# Patient Record
Sex: Male | Born: 1935 | Race: White | Hispanic: No | Marital: Single | Smoking: Current some day smoker
Health system: Southern US, Community
[De-identification: ages and names within clinical notes are randomized; demographics above are authoritative.]

## PROBLEM LIST (undated history)

## (undated) DIAGNOSIS — I10 Essential (primary) hypertension: Secondary | ICD-10-CM

## (undated) DIAGNOSIS — J449 Chronic obstructive pulmonary disease, unspecified: Secondary | ICD-10-CM

## (undated) DIAGNOSIS — E785 Hyperlipidemia, unspecified: Secondary | ICD-10-CM

## (undated) DIAGNOSIS — E119 Type 2 diabetes mellitus without complications: Secondary | ICD-10-CM

## (undated) HISTORY — PX: COLON SURGERY: SHX602

---

## 2004-01-19 ENCOUNTER — Other Ambulatory Visit: Payer: Self-pay

## 2005-01-22 ENCOUNTER — Inpatient Hospital Stay: Payer: Self-pay | Admitting: Internal Medicine

## 2005-01-24 ENCOUNTER — Other Ambulatory Visit: Payer: Self-pay

## 2005-02-16 ENCOUNTER — Inpatient Hospital Stay: Payer: Self-pay | Admitting: Internal Medicine

## 2005-03-03 ENCOUNTER — Ambulatory Visit: Payer: Self-pay | Admitting: Internal Medicine

## 2005-04-19 ENCOUNTER — Encounter (INDEPENDENT_AMBULATORY_CARE_PROVIDER_SITE_OTHER): Payer: Self-pay | Admitting: Cardiology

## 2005-04-19 ENCOUNTER — Inpatient Hospital Stay (HOSPITAL_COMMUNITY): Admission: EM | Admit: 2005-04-19 | Discharge: 2005-04-28 | Payer: Self-pay | Admitting: Emergency Medicine

## 2005-04-20 ENCOUNTER — Ambulatory Visit: Payer: Self-pay | Admitting: Hematology and Oncology

## 2005-04-20 ENCOUNTER — Ambulatory Visit: Payer: Self-pay | Admitting: Gastroenterology

## 2005-04-22 ENCOUNTER — Encounter (INDEPENDENT_AMBULATORY_CARE_PROVIDER_SITE_OTHER): Payer: Self-pay | Admitting: *Deleted

## 2005-04-28 ENCOUNTER — Ambulatory Visit: Payer: Self-pay | Admitting: Hematology and Oncology

## 2007-03-12 ENCOUNTER — Inpatient Hospital Stay (HOSPITAL_COMMUNITY): Admission: EM | Admit: 2007-03-12 | Discharge: 2007-03-16 | Payer: Self-pay | Admitting: Emergency Medicine

## 2007-03-12 ENCOUNTER — Encounter: Payer: Self-pay | Admitting: Gastroenterology

## 2007-03-14 ENCOUNTER — Ambulatory Visit: Payer: Self-pay | Admitting: Gastroenterology

## 2007-03-15 ENCOUNTER — Encounter: Payer: Self-pay | Admitting: Gastroenterology

## 2007-03-27 ENCOUNTER — Inpatient Hospital Stay (HOSPITAL_COMMUNITY): Admission: EM | Admit: 2007-03-27 | Discharge: 2007-04-05 | Payer: Self-pay | Admitting: Emergency Medicine

## 2007-04-16 ENCOUNTER — Ambulatory Visit: Payer: Self-pay | Admitting: Internal Medicine

## 2008-04-16 IMAGING — NM NM GI BLOOD LOSS
2 series · 12 of 12 positions shown · non-contrast
Comparison: None.

CLINICAL DATA: Melena over the last 5 days.

NM GASTROINTESTINAL BLEEDING SCAN  03/30/2007:
TECHNIQUE: Sequential abdominal images were obtained following intravenous
administration of Oc-77m labeled red blood cells.
Radiopharmaceutical:  25 mCi Oc-77m in-vitro labeled red cells

[Series 1: gi gi bleed · 4.70mm/px · 6 of 60 frames shown (1 of 2)]
[frame 6/60]
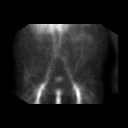
[frame 16/60]
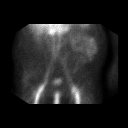
[frame 26/60]
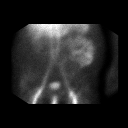
[frame 36/60]
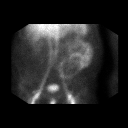
[frame 46/60]
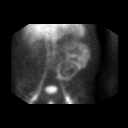
[frame 56/60]
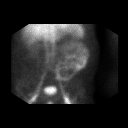

[Series 1: gi gi bleed · 4.70mm/px · 6 of 60 frames shown (2 of 2)]
[frame 6/60]
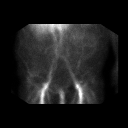
[frame 16/60]
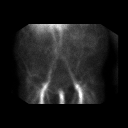
[frame 26/60]
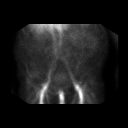
[frame 36/60]
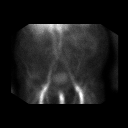
[frame 46/60]
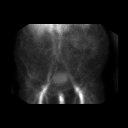
[frame 56/60]
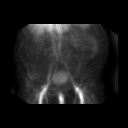

[12 of 12 positions shown; findings below may reference images not displayed]

FINDINGS: Active gastrointestinal bleeding identified in the left upper
quadrant of the abdomen, within a loop of jejunum. Radiotracer was then carried
through the small bowel of the left upper quadrant. Expected excretion into the
urinary tract noted.
IMPRESSION: Active gastrointestinal bleeding involving the jejunum within the left upper
quadrant. 

Results were text paged and telephoned to the [REDACTED]ist caring for
the patient at the time of interpretation 03/30/2007  6487 hours.

## 2009-02-26 ENCOUNTER — Encounter (INDEPENDENT_AMBULATORY_CARE_PROVIDER_SITE_OTHER): Payer: Self-pay | Admitting: *Deleted

## 2009-11-03 ENCOUNTER — Telehealth: Payer: Self-pay | Admitting: Gastroenterology

## 2010-07-13 NOTE — Progress Notes (Signed)
Summary: Schedule Colonoscopy  Phone Note Outgoing Call   Call placed by: Lamona Curl CMA Duncan Dull),  Nov 03, 2009 1:51 PM Call placed to: Patient Summary of Call: Patient is overdue for colonoscopy at this time. His last colonoscopy in 2008 showed adenomatous colon polyps, diverticulosis and internal hemorrhoids which he needs to have followed up. I have left message on patient's voicemail to call back. Initial call taken by: Lamona Curl CMA Duncan Dull),  Nov 03, 2009 1:53 PM  Follow-up for Phone Call        I have left a message for the patient to call back.  Follow-up by: Lamona Curl CMA Duncan Dull),  Nov 10, 2009 9:16 AM     Appended Document: Schedule Colonoscopy Patient never returned phone call. We will send a letter.

## 2010-10-26 NOTE — Discharge Summary (Signed)
NAME:  Cichowski, Vedder                  ACCOUNT NO.:  [id number]   MEDICAL RECORD NO.:  [id number]          PATIENT TYPE:  INP   LOCATION:  5501                         FACILITY:  [facility]   PHYSICIAN:  Hind I Elsaid, MD      DATE OF BIRTH:  1932-09-12   DATE OF ADMISSION:  03/27/2007  DATE OF DISCHARGE:                               DISCHARGE SUMMARY   INTERIM DISCHARGE SUMMARY:   PRIMARY CARE PHYSICIAN:  Dr. Maryellen Pile from [facility].   DISCHARGE DIAGNOSES:  1. Acute blood loss anemia, status post 7 units of blood transfusions.  2. 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  3. Arteriovenous malformation/dysplasia.  4. Diabetes mellitus.  5. Diverticulosis.  6. Cystic ulcer disease.  7. History of coronary artery disease.  8. Chronic renal insufficiency with a baseline creatinine of 1.4.   DISCHARGE MEDICATIONS:  To be dictated at the date of discharge.   CONSULTATIONS:  Gastroenterology consulted for evaluation of the active  GI bleeding.  Done by Dr. Juanda Chance and Dr. Cay Schillings.   PROCEDURES:  1. Chest x-ray:  No acute cardiopulmonary disease, probable COPD.      Could represent as a vessel with a circumscribed 1 cm nodule.      Consider follow up plain film in approximately 3 months or a      nonemergent outpatient chest CT to be performed.  2. RBC nuclear scan which showed active gastrointestinal bleeding      involving the jejunum within the left upper quadrant.  3. Small bowel enteroscopy.  Result was normal proximal esophagus      jejunum with bariatric colonoscopy was passed as stably as possible      to the small bowel.  No small bowel AVM were seen.  There was no      active bleeding.   HISTORY OF PRESENT ILLNESS:  Please review the history done by Dr.  Lilly Cove.  This is a 77 year old pleasant male, recent  hospitalization at the end of September and beginning of October for  lower GI bleeding required a blood  transfusion, underwent colonoscopy  and EGD and was found to have benign colonic polyp and angiodysplasia.  There was no evidence of active bleeding.  This time, the patient  presented with dark stool and anemia with hemoglobin of 7.  1. Acute blood loss anemia secondary to GI bleeding:  The patient was      admitted to the hospital.  The patient received blood transfusion.      During hospitalization, almost about 7 units of packed RBCs.      Gastroenterology were consulted where review of previous      colonoscopy and EGD was done.  Nuclear RBC scan was done also      during this hospitalization which showed evidence of active blood      loss on the upper part of the jejunum.  At that time, the patient      was mildly hypotensive and tachycardic status post blood      transfusion.  Gastroenterology recommended enteroscopy which was      done by Dr. Cay Schillings during the weekend without evidence of active      blood loss found during the endoscopy.  After that, the patient      remained stable with hemoglobin around 10.2.  Plan to discharge the      patient with ferrous sulfate.  Yesterday, the patient also noticed      dark melena and became mildly hypotensive status post 1 unit of      blood transfusion.  Gastroenterology informed about the situation,      and their recommendation was to transfuse 1 unit of blood      transfusion, and the patient is not actively bleeding.  The amount      of blood loss is not more than 1 mL per minute for evaluation for      arteriogram and possible embolization.  Plan for the patient to      continue to monitor H and H.  If he has signs of free bleeding      clinically, which includes hypotension, tachycardia, or large      volume melena, he should be arranged for immediate angiography      targeting the region of the bowel with his jejunum.  Will continue      to monitor the patient periodically with CBC and monitor the      clinical situation of the  patient.  2. Diabetes mellitus:  The patient during hospitalization remained on      insulin.  To restart his metformin, his oral hypoglycemic      medication, on the date of discharge.  A detailed discussion of the      patient as he has chronic renal insufficiency with a baseline      creatinine of 1.4, metformin dose is contraindicated but the      patient refused to start any other hypoglycemic agent.  3. Chronic renal insufficiency:  Remained stable around 1.4 to 1.3.  4. Coronary artery disease:  Remained stable.   DISPOSITION:  The patient is to be discharged home when the bleeding  stops and after further evaluation by gastroenterology.  They would  recommend further workup.      Hind Bosie Helper, MD  Electronically Signed     HIE/MEDQ  D:  04/03/2007  T:  04/04/2007  Job:  [id number]

## 2010-10-26 NOTE — H&P (Signed)
NAME:  [name] [name], [name] [name]                  ACCOUNT NO.:  [id number]   MEDICAL RECORD NO.:  [id number]          PATIENT TYPE:  INP   LOCATION:  5501                         FACILITY:  [facility]   PHYSICIAN:  Wilson Singer, M.D.DATE OF BIRTH:  [birth year]   DATE OF ADMISSION:  03/27/2007  DATE OF DISCHARGE:                              HISTORY & PHYSICAL   HISTORY:  This is a very pleasant 77 year old man who was recently  hospitalized at the end of September, beginning of October for a lower  GI bleed which required a blood transfusion.  He underwent colonoscopy  at that time, and was found to have benign colon polyps, but no other  source of bleeding.  He has a history of cecal arteriovenous  malformation and angiodysplasia which led to endoclips and hemostasis  achievement in 2006.  He also has a history of diverticulosis.  He now  presents with another 3 to 4 day history of rectal bleeding.  He feels  short of breath and tired.   There is no hematemesis or abdominal pain.   PAST MEDICAL HISTORY:  1. Cecal AVM as mentioned above.  2. Diverticulosis as mentioned above.  3. Type 2 non-insulin dependent diabetes mellitus.  4. History of peptic ulcer disease in the past.  5. History of carotid artery disease but requiring no surgery.   PSYCHIATRIC DIAGNOSIS:  Removal of a colon tumor which was benign in  2006.   SOCIAL HISTORY:  He is a widower.  He does not smoke.  He does not drink  alcohol.  He is retired.   MEDICATIONS:  1. Metformin 1 gram b.i.d.  2. Altace 5 mg every day.  3. Omeprazole 20 mg every day.  4. Glipizide 10 mg b.i.d.  5. Aspirin 1 tablet every day.  6. Ferrous sulfate 325 mg t.i.d.  7. Folic acid 1 mg every day.   ALLERGIES:  NONE.   FAMILY HISTORY:  Noncontributory.   REVIEW OF SYSTEMS:  The problem list is mentioned above.  There are no  other symptoms referable to all systems reviewed.   EXAMINATION:  VITAL SIGNS:  Appear to be stable.  GENERAL:  He does  look clinically pale.  CARDIOVASCULAR:  Heart sounds are present and normal.  RESPIRATORY:  Lung fields are clear.  ABDOMEN:  Soft, nontender with no hepatosplenomegaly.  Fecal occult  blood is positive.  NEUROLOGICAL:  Alert and oriented with no focal neurological signs.   INVESTIGATIONS:  Hemoglobin 7, with an MCV of 74.5, white blood cell  count 16.2, platelets 401,000.  Sodium 133, potassium 4, bicarbonate 26,  glucose 367, BUN 41, creatinine 1.66.  A chest x-ray shows no major  abnormalities except for a well circumscribed 1 cm nodule at the left  lung base.  Suggestion is made to get a CT of the chest non-emergently.   IMPRESSION:  1. Lower gastrointestinal bleed, possible cecal arteriovenous      malformation.  2. Type 2 non-insulin dependent diabetes mellitus.  3. Hypertension.   PLAN:  1. Admit.  2. Transfuse 3 units of packed red blood  cells.  3. Control diabetes and hypertension.  4. Consider a PGI consultation in the morning.   Further recommendations will depend on the patient's hospital progress.      Wilson Singer, M.D.  Electronically Signed     NCG/MEDQ  D:  03/27/2007  T:  03/28/2007  Job:  [id number]

## 2010-10-26 NOTE — Discharge Summary (Signed)
NAME:  [name] [name], [name] [name]                  ACCOUNT NO.:  [id number]   MEDICAL RECORD NO.:  [id number]          PATIENT TYPE:  INP   LOCATION:  6702                         FACILITY:  [facility]   PHYSICIAN:  Hind I Elsaid, MD      DATE OF BIRTH:  08/02/1933   DATE OF ADMISSION:  03/11/2007  DATE OF DISCHARGE:                               DISCHARGE SUMMARY   PRIMARY CARE PHYSICIAN:  Dr. Maryellen Pile from [facility]   DISCHARGE DIAGNOSES:  1. Anemia secondary to chronic blood loss.  2. Cecal arteriovenous malformation.  3. Angiodysplasia without hemorrhage.  4. Arteriovenous malformation/angiodysplasia without hemorrhage.  5. Diabetes mellitus.  6. Diverticulosis.  7. Peptic ulcer disease.  8. History of coronary artery disease.  9. Hemorrhoids.  10._adenomatousis  polyps.   DISCHARGE MEDICATIONS:  1. Metformin 1 g p.o. b.i.d.  2. Omeprazole 20 mg daily.  3. Aspirin 81 mg p.o. daily.  4. Norpace 5 mg p.o. daily.  5. Glyburide 10 mg p.o. daily.  6. Folic acid 1 mg p.o. daily.  7. Ferrous sulfate 325 mg p.o. b.i.d.   CONSULTATION:  Dr. Dalene Carrow was consulted.   PROCEDURES:  1. Abdominal x-ray  chronic obstructive pulmonary disease and chronic      bronchitis.  No acute abnormalities.  2. EGD and colonoscopy showed diverticulosis/colon polyp and      hemorrhoids.  EGD showed angiodysplasia without hemorrhage,  with      no active intervention, status post biopsy of the small bowel to      evaluate for tropical sprue.   HISTORY OF PRESENT ILLNESS:  This is a 77 year old male with a history  of cecal AV malformation and angiodysplasia status post hemostasis with  endoclips done in 2006.  History of iron-deficiency anemia secondary to  rectal bleeding.  Presented to the hospital where he complained of black  stools  and anemia, with hemoglobin of 7 when checked in the PMDs  office, where he was advised to come to the hospital for further  evaluation.   PROBLEMS:  1. Rectal bleeding, most  probably secondary to cecal AV malformation      and angiodysplasia.  Patient was admitted to the hospital.      Received total of 3 units of PRBCS , gastroenterology consulted,      where the patient underwent EGD/colonoscopy with the results      showing  angiodysplasia without evidence of active BLEEDINGeding,      without evidence of active bleeding.  No intervention was done by      gastroenterologist, and they recommend to continue on folic acid      and ferrous sulfate.  Hemoglobin remained stable during      hospitalization.  Also, patient underwent biopsy of the small bowel      to evaluate for tropical  sprue result pending.  2. Diabetes mellitus.  Patient on metformin and glyburide, which was      discontinued during hospitalization secondary to keeping the      patient n.p.o.  Patient was placed on insulin Lantus 15 units, and  CBG was in good control.  Also, hemoglobin A1c was done during      hospitalization which was 8.9.  Patient was discharged on his      metformin and glyburide dose.  Further recommendation to be      addressed by his primary care for further adjustment in his finger      sticks.  Patient was informed regarding the above labs.  3. History of peptic ulcer disease.  To continue his omeprazole.   The patient to be discharged home.  Follow with his primary care,  periodically checking his hemoglobin and to continue with ferrous  sulfate and folic acid.  Regarding uncontrolled diabetes mellitus,  patient will be discharged on his home medications.  Further  recommendation to be addressed by his primary care physician.      Hind Bosie Helper, MD  Electronically Signed     HIE/MEDQ  D:  03/16/2007  T:  03/16/2007  Job:  [id number]

## 2010-10-26 NOTE — H&P (Signed)
NAME:  [name] [name], [name] [name]                  ACCOUNT NO.:  [id number]   MEDICAL RECORD NO.:  [id number]          PATIENT TYPE:  INP   LOCATION:  6702                         FACILITY:  [facility]   PHYSICIAN:  Hind I Elsaid, MD      DATE OF BIRTH:  [birth year]   DATE OF ADMISSION:  03/12/2007  DATE OF DISCHARGE:                              HISTORY & PHYSICAL   PRIMARY CARE PHYSICIAN:  Dr. Lelon Frohlich from [place].   HISTORY OF PRESENT ILLNESS:  A 77 year old male with a history of cecal  arteriovenous malformation and angiodysplasia status post hemostasis in  2006, done by Dr. Rob Bunting and Hedwig Morton. Juanda Chance, M.D. during last  admission.  He is status post hemostasis with epinephrine and endoclips.  History of iron deficiency anemia secondary to rectal bleeding,  presented to the hospital with chief complaint of darker stool for more  than 2 weeks.  The patient denies any abdominal pain and denies any  nausea and vomiting, denies any hematemesis.  The patient has seen his  primary care on Wednesday, where he evaluated his hemoglobin, found it  very low and he recommended him to come to the hospital for further  evaluation and possibility of receiving blood transfusion.  The patient  admitted he also complained of shortness of breath on mild exertion and  some blurring of vision and feeling of dizziness.  The patient denies  any paroxysmal nocturnal dyspnea or orthopnea.  Denies any cough.  Denies any shortness of breath.  Denies any abdominal pain.   PAST MEDICAL HISTORY:  1. History of cecal arteriovenous malformation/angiodysplasia status      post hemostasis with epinephrine and endoclips.  2. Iron deficiency anemia.  3. Diverticulosis.  4. Diabetes mellitus.  5. Peptic ulcer disease.  6. Colon tumor status post resection.  7. History of carotid artery disease.   MEDICATIONS:  1. Metformin 1 gram p.o. b.i.d.  2. Omeprazole 20 mg p.o. daily.  3. Aspirin 81 mg.  4. Altace 5 mg p.o.  daily.  5. Glyburide 10 mg p.o. daily.   ALLERGIES:  NO KNOWN DRUG ALLERGIES.   FAMILY HISTORY:  Mother is deceased secondary to stroke.  Dad is  deceased secondary to stomach problem and his alcohol abuse.   PAST SURGICAL HISTORY:  History of colon resection.   SOCIAL HISTORY:  He lives alone.  He quit tobacco abuse more than 25  years ago.  He drinks alcohol occasionally.  Has 2 daughters.   REVIEW OF SYSTEMS:  Complained of generalized body weakness and  shortness of breath on minimal exertion.  No diarrhea.  No constipation.  No dysuria.  Denies any hematemesis.   PHYSICAL EXAMINATION:  VITAL SIGNS:  Blood pressure 153/83, pulse rate  93, respiratory rate 24, temperature 97.6, blood pressure 90/55, one  episode in the ED.   LABORATORY DATA:  EKG normal sinus rhythm.  CBC was white blood cell  10.9, hemoglobin 7.1, hematocrit 22.6 with MCV 66.4 and MCH 31.5,  platelets 358.  PT 30, PTT 32 and PT/INR was 1.  Lipase was 39.  CMP  sodium 133, potassium 3.5, chloride 101, CO2 24, glucose 233, and BUN  13, creatinine 1.53 and he was hemoccult positive.   ASSESSMENT/PLAN:  1. Rectal bleeding thought to be secondary to cecal arteriovenous      malformation with angiodysplasia.  We will admit the patient,      transfuse 3 units of packed red blood cells.  Place the patient on      Protonix IV.  Place him on a clear liquid diet.  We will consult      the [facility] to evaluate the patient if further      workup is needed.  2. Diabetes mellitus, which seems not under control.  We will get      hemoglobin C1.  We will hold on metformin at this time as the      patient has a creatinine of 1.5, we will start the patient on low      dose insulin, Lantus in addition to NovoLog sliding scale.  Further      recommendation to be addressed during hospitalization.  3. Renal insufficiency.  Previously the patient's baseline creatinine      was 1.3, creatinine at this time 1.53.   Rule out diabetic      nephropathy versus acute renal insufficiency.  We will continue      with the IV fluid hydrations.  We will get an ultrasound of the      kidney and we will monitor the renal function, most probably is      chronic in nature.  Deep venous thrombosis and gastrointestinal      prophylaxis, we will hold off on Lovenox at this time.  We will      continue with sequential compression devices.      Hind Bosie Helper, MD  Electronically Signed     HIE/MEDQ  D:  03/12/2007  T:  03/12/2007  Job:  [id number]

## 2010-10-26 NOTE — Discharge Summary (Signed)
NAME:  [name] [name], [name] [name]                  ACCOUNT NO.:  [id number]   MEDICAL RECORD NO.:  [id number]          PATIENT TYPE:  INP   LOCATION:  5501                         FACILITY:  [facility]   PHYSICIAN:  Mobolaji B. Bakare, M.D.DATE OF BIRTH:  1932/08/21   DATE OF ADMISSION:  03/27/2007  DATE OF DISCHARGE:  04/05/2007                               DISCHARGE SUMMARY   PRIMARY CARE PHYSICIAN:  Dr. Maryellen Pile in [place].   This an addendum to an earlier dictation done by Dr. Eda Paschal on April 03, 2007.   Sequel to the previous interim discharge summary, [name] hemoglobin  remained stable between 10.4 to 10.6.  He received a total of 6 units of  packed red blood cells during this hospitalization.  At the time of  discharge, he was hemodynamically stable.  Hemoglobin was 10.3,  hematocrit was 30.7.   DISCHARGE MEDICATIONS:  1. Metformin 1,000 mg b.i.d.  2. Altace 5 mg daily.  3. Omeprazole 20 mg daily.  4. Vitamin B12 daily.  5. Ferrous sulfate 1 two times a day.  6. Glipizide should be restarted when CBG is greater than 130 mg/dL,      __________  being on metformin.  7. Ferrous sulfate 1 tablet, two times a day.  8. Aspirin has been discontinued, until he follows up with Dr. Maryellen Pile      in [place].   DISCHARGE LABORATORY DATA:  Sodium 141, potassium 3.3 (this was repleted  prior to discharge), chloride 104, bicarb 28, BUN 12, creatinine 1.38,  glucose 111, calcium 8.7, white cell 8.1, hemoglobin 10.3, hematocrit  30.7, MCV 84, platelets 410.   Vitals on the time of discharge:  Temperature 98, pulse of 100,  respiratory rate 28, blood pressure 127/78, 98% on room air.      Mobolaji B. Corky Downs, M.D.  Electronically Signed     MBB/MEDQ  D:  04/05/2007  T:  04/06/2007  Job:  [id number]   cc:   Kathlee Nations

## 2010-10-29 NOTE — Consult Note (Signed)
NAME:  Heidelberger, Tc                  ACCOUNT NO.:  [id number]   MEDICAL RECORD NO.:  [id number]          PATIENT TYPE:  INP   LOCATION:  6731                         FACILITY:  [facility]   PHYSICIAN:  Lauretta I. Odogwu, M.D.DATE OF BIRTH:  [birth year]   DATE OF CONSULTATION:  04/20/2005  DATE OF DISCHARGE:                                   CONSULTATION   REASON FOR CONSULTATION:  Anemia.   HISTORY OF PRESENT ILLNESS:  Mr. Rosenbloom is a 77 year old man from [place],  admitted on April 18, 2005, with symptomatic anemia primarily with dyspnea  on exertion.  Admitting hemoglobin and hematocrit were 3.7 and 12.6,  respectively.  The patient has received a total of six units of packed RBC  so far.  His Hemoccult is positive.  He states that he has a longstanding  history of anemia.  He is known to have a benign colon mass, which was  resected.  Due to recurrence of anemia an upper EGD-colonoscopy performed at  the peripheral hospital, was negative.   PAST MEDICAL HISTORY:  1.  Diabetes mellitus.  2.  History of CAD.  3.  Status post colon resection for benign lesion.   MEDICATIONS:  Ferrous sulfate 325 mg b.i.d., folic acid, Ambien, insulin,  Lasix, Benadryl, Protonix, and Albuterol.   ALLERGIES:  No known drug allergies.   SOCIAL HISTORY:  The patient is widowed.  He lives alone in [place].  Nonsmoker.  No alcohol history.   FAMILY HISTORY:  Remarkable for CVA.  His father had stomach cancer. There  is no history of hematological disorder.   REVIEW OF SYSTEMS:  Positive for cough, with no sputum production.  No  weight loss, no edema, no anorexia. Otherwise essentially negative.   LABORATORY DATA:  Hemoglobin 8.2, hematocrit 25.1, white count 8.6,  platelets 393.  Reticulocyte count 0.9.  PT 14.4, INR 1.1.  Total protein 6.2.  Sodium 139, potassium 3.9, BUN 7, creatinine 1.4, glucose 152.  Iron 18, TIBC 577, ferritin 1.  SPEP with no monoclonal gammopathy.  Erythropoietin is  1497.   PHYSICAL EXAMINATION:  GENERAL:  Well appearing, well nourished man in no  acute distress.  He is alert and oriented x3.  VITAL SIGNS:  Blood pressure 100/59, pulse 67, temperature 97.5,  respirations 20, saturation of oxygen 98% on room air.  HEENT:  Sclerae anicteric.  PERRL.  Mouth without lesions or mucositis.  No  thrush.  NECK:  Supple, no JVD, no thyromegaly.  Trachea was central.  CHEST:  Lungs clear to percussion with slightly decreased air exchange  bilaterally.  CARDIOVASCULAR:  Regular rate and rhythm without murmurs, rubs or gallops.  ABDOMEN:  Soft and nontender.  THere was no hepatosplenomegaly.  Bowel  sounds present.  EXTREMITIES:  Without edema.  Pulses present bilaterally.   ASSESSMENT AND PLAN:  1.  Severe iron-deficiency anemia with heme-positive stools.  Rule out      gastrointestinal bleed.  2.  Marrow suppression with poor reticulocyte response with degree of      anemia. Rule out marrow effect.   PLAN:  Consider GI evaluation while in house.  I plan to perform a bone  marrow biopsy as an outpatient upon discharge.  In the meantime, please  check vitamin B12 with serum folate.  IV iron in the form of InFeD would be  beneficial, dosed to 1500 in 1 L normal saline over four hours, with a test  dose of 25 mg, premedicating with Tylenol and Benadryl.   Thank you very much for allowing [place] the opportunity to participate in the  care of Mr. Guymon.      Marlowe Kays, P.A.      Lauretta I. Odogwu, M.D.  Electronically Signed    SW/MEDQ  D:  04/22/2005  T:  04/22/2005  Job:  [id number]

## 2010-10-29 NOTE — Discharge Summary (Signed)
NAME:  [name] [name], [name] [name]                  ACCOUNT NO.:  [id number]   MEDICAL RECORD NO.:  [id number]          PATIENT TYPE:  INP   LOCATION:  6731                         FACILITY:  [facility]   PHYSICIAN:  Kela Millin, M.D.DATE OF BIRTH:  09-21-1932   DATE OF ADMISSION:  04/18/2005  DATE OF DISCHARGE:  04/28/2005                                 DISCHARGE SUMMARY   DISCHARGE DIAGNOSES:  1.  Cecal arteriovenous malformation/angiodysplasia, status post hemostasis      with epinephrine and BiCAP with Endoclips per gastroenterology.  2.  Iron-deficiency anemia, secondary to #1.  3.  Diverticulosis, per colonoscopy.  4.  Diabetes mellitus.  5.  History of peptic ulcer disease.  6.  Colon tumor - not cancer according to patient's daughter, status post      resection.  __________ a colon mass and he had surgery with resection of the bowel and  reanastomosis.  1.  History of carotid artery disease.   CONSULTATIONS:  1.  Gastroenterology, Corinda Gubler, Rachael Fee, M.D., and Lina Sar, M.D.  2.  Hematology/Oncology, Lauretta I. Odogwu, M.D.   PROCEDURES:  1.  EGD, April 22, 2005, normal exam per Dr. Christella Hartigan.  2.  Colonoscopy April 25, 2005, per Dr. Juanda Chance.  A single bleeding AVM in      cecum, ablated.  Also diverticulosis noted.  3.  Capsule endoscopy on April 26, 2005.  One small AVM in small bowel      noted.  4.  Abdominal ultrasound on April 21, 2005 - Normal abdominal sonogram.   HISTORY:  The patient is a 77 year old white male with known past medical  history for anemia of unclear etiology, diabetes, who presented with  complaints of shortness of breath, weakness and left arm pain.  It was noted  that about one year prior to this presentation, the patient was anemic and  workup at that time revealed a colon mass.  The patient had surgery with  resection of bowel and reanastomosis.  According to the patient's daughter,  the lesion was benign.  The patient's anemia  recurred several months later,  requiring transfusion, and he was reexamined with EGD, colonoscopy, which  were also negative.  Most of the patient's workup was done in [facility].  The patient reported on presentation that he had had  about seven days of increasing weakness, increasing shortness of breath that  was more prominent with exertion.  He denied PND, also denies orthopnea.  He  reported that he had noted some black stools but no frank blood.   PHYSICAL EXAMINATION:  VITAL SIGNS:  His physical exam upon admission per  Melissa L. Ladona Ridgel, M.D., revealed a temperature of 98.4, blood pressure of  133/59, pulse of 103, respiratory rate of 20, O2 saturation of 97%.  GENERAL:  The patient was noted to be a pale white male in no acute  distress.   The pertinent findings on physical exam were:  CARDIOVASCULAR:  He had a regular rate and rhythm, normal S1, S2.  His heart  sounds were distant with displaced PMI noted  toward the midaxillary line.  ABDOMEN:  Soft, nontender, nondistended, with bowel sounds present.  RECTAL:  Heme-negative in the ER.  Soft brown stool was noted.  EXTREMITIES:  Noted to be pale.  The rest of his physical exam was noted to be within normal limits.   LABORATORY DATA:  The PT was 14.4, INR was 1.1, PTT of 30, white cell count  of 9.7, hemoglobin of 3.7, hematocrit 12.6, platelet count of 449, MCV of  59.5.  Sodium 136, potassium 2.6, chloride 103, CO2 of 24, BUN 11,  creatinine 1.3, glucose 157, calcium 8.2.  His total protein was 6.2,  albumin of 3.2, LFTs within normal limits.  Urinalysis was negative for  infection, and chest x-ray showed bibasilar atelectasis.  His CK was 40, MB  1.3, and brain natriuretic peptide of 157.7.  His white cell count was 9.7,  hemoglobin 3.7, hematocrit 12.6, platelet count of 449 with an MCV of 59.5.   Problem 1.  MICROCYTIC ANEMIA:  Upon admission, anemia studies were obtained  and the patient was transfused  packed red blood cells.  Status post  transfusion, his hemoglobin improved to 8.2 with a hematocrit of 25.1.  The  reticulocyte count was noted to be 0.9.  TIBC of 577, ferritin of 1, iron of  18, erythropoietin was 1497.  SPEP showed no monoclonal gammopathy.  His B12  level was 272, and his RBC folate of 587.  The patient was also noted to be  Hemoccult-positive, and gastroenterology was consulted and [facility] saw the patient.  He had endoscopic studies done, and the results are  stated above:  EGD within normal limits, colonoscopy revealed a single  bleeding AVM in the cecum and the capsule endoscopy revealed AVM in the  small bowel.  The patient was given IV iron while in the hospital and was  then started on oral iron and will continue this upon discharge.  Hematology/Oncology was also consulted to see the patient, and Dr. Dalene Carrow  saw the patient and her assessment was that the patient also had bone marrow  suppression with poor reticulocyte response.  She plans to do a bone marrow  as an outpatient and also recommended IV iron in the hospital, which the  patient received.  Following this intervention, Ms. Malerba hemoglobin has  improved and his last hemoglobin prior to discharge is 11.1 and 33.5.  He  has not had any hematochezia or melena.  The patient has also remained  hemodynamically stable and will be discharged at this time to follow up with  Dr. Dalene Carrow as scheduled and with his gastroenterologist, Dr. Maryruth Bun.  He will  also follow up with his primary care physician.   Problem 2.  CECAL ARTERIOVENOUS MALFORMATION:  As discussed above, status  post hemostasis with epinephrine, BiCAP and Endoclips per gastroenterology.  The patient is to follow up with GI.   Problem 3.  DIABETES MELLITUS:  The patient's Accu-Checks were monitored  during his hospital stay and he was treated with insulin.  The patient is to  resume his metformin as previously on discharge.   Problem  4.  HISTORY OF PEPTIC ULCER DISEASE:  The patient is to continue  Protonix on discharge.   DISCHARGE MEDICATIONS:  1.  Protonix 40 mg one p.o. daily.  2.  Iron sulfate 325 mg one p.o. b.i.d.  3.  Senokot two p.o. q.h.s., hold for diarrhea.  4.  Vitamin B12 1000 mcg p.o. daily.  5.  Patient  to continue metformin as previously.   FOLLOW-UP CARE:  1.  Primary care physician, Dr. Deatra Canter in [facility] in one week.  2.  Dr. Dalene Carrow on May 06, 2005, at 9 a.m., for bone marrow biopsy .  3.  Dr. Maryruth Bun, gastroenterologist in [place] as needed.   DISCHARGE CONDITION:  Improved and stable.           ______________________________  Kela Millin, M.D.     ACV/MEDQ  D:  04/28/2005  T:  04/28/2005  Job:  [id number]   cc:   Vicente Serene I. Odogwu, M.D.  Fax: [phone]   Bonnita Levan  Fax: [phone]   Kathlee Nations  Fax: [phone]   Dr. Dierdre Harness, [place]

## 2010-10-29 NOTE — H&P (Signed)
NAME:  [name] [name]                  ACCOUNT NO.:  [id number]   MEDICAL RECORD NO.:  [id number]          PATIENT TYPE:  EMS   LOCATION:  MAJO                         FACILITY:  [facility]   PHYSICIAN:  Melissa L. Ladona Ridgel, MD  DATE OF BIRTH:  1933-05-01   DATE OF ADMISSION:  04/18/2005  DATE OF DISCHARGE:                                HISTORY & PHYSICAL   CHIEF COMPLAINT:  Shortness of breath, weakness, and left arm pain.   PRIMARY CARE PHYSICIAN:  Dr. Toy Cookey in [place].   HISTORY OF PRESENT ILLNESS:  The patient is a 77 year old white male with a  known past medical history for anemia of unclear etiology and diabetes. One  year ago, the patient was noted to be anemic. He was diagnosed at that time  with a colon mass on workup. He had surgery with resection of the bowel and  reanastomosis. According to the patient's daughter, the lesion was benign.  His anemia recurred several months later requiring transfusion and he was  reexamined with EGD colonoscopy which were also negative. Most of his workup  has been done at [facility]. The records are not available  to [place] at this time.   The patient states that today it is a culmination of about seven days of  increasing weakness, increasing shortness of breath more prominent with  exertion. He describes no PND and no orthopnea. He has noted some black  stools but no frank blood. The patient states he has never seen a  hematologist.   REVIEW OF SYSTEMS:  Numbness in the left elbow. Positive chills. No  diarrhea, no constipation, no dysuria. Positive urgency. He did have a dark  stool two to three days ago. All of the other review of systems are negative  including hematemesis.   PAST MEDICAL HISTORY:  1.  Colon tumor resected, which was not cancer according to the patient's      daughter.  2.  His last transfusion was in September.  3.  He has diabetes.  4.  He has carotid artery disease, the site of which is unknown  at this time      but he does have 100% blockage.   PAST SURGICAL HISTORY:  Colon resection.   SOCIAL HISTORY:  He quit tobacco 20 years ago. He drinks an occasional beer.  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 He has  two daughters-one with diabetes.   FAMILY HISTORY:  Mom is deceased secondary to stroke. Dad is deceased  secondary to stomach problems and ethanol use. He has no foreign ancestry  that he is aware of.   ALLERGIES:  No known drug allergies.   MEDICATIONS:  1.  Metformin 500 milligrams 2 tablets in the a.m. and 1 tablet in the p.m.  2.  Aspirin 325 milligrams daily.   PHYSICAL EXAMINATION:  VITAL SIGNS:  Temperature 98.4, blood pressure  133/59, pulse is 103, respirations 20. Saturation 97%.  GENERAL:  This is a pale white male in no acute distress. He is not  excessively dyspneic.  HEENT:  He is normocephalic and atraumatic. Pupils are equal, round, and  reactive to light. Extraocular movements intact. Mucus membranes are dry.  NECK:  Supple. There is no JVD, no lymph nodes, and no carotid bruits.  CHEST:  Distant with no rhonchi, rales, or wheezes.  CARDIOVASCULAR:  Regular rate and rhythm. Positive S1 and S2. No S3 or S4.  He has distant heart sounds with a displaced PMI noted towards the  midaxillary line.  ABDOMEN:  Soft, nontender, and nondistended with positive bowel sounds.  RECTAL:  Heme negative in the emergency room. Soft brown stool is noted.  EXTREMITIES:  No clubbing, cyanosis, or edema. He does have positive pallor.  NEUROLOGICAL:  The patient is nonfocal with cranial nerves II through XII  intact. Power is 5/5. DTRs are 2+. Plantars are downgoing.   LABORATORY DATA:  PT and INR of 14.4, INR of 1.1, PTT of 30. Her white count  is 9.7, hemoglobin is 3.7, hematocrit is 12.6, platelets of 449,000 and a  MCV of 59.5. Sodium is 136, potassium 2.6, chloride is 103, CO2 24, BUN 11,  creatinine is 1.3. Glucose is 157. His  calcium is 8.2. Total protein is 6.2,  albumin 3.2. LFT's are within normal limits. Urinalysis is negative. X-ray  shows bibasilar atelectasis with no infection. CK total is 40, MB is 1.3,  BNP is 157.7 with a troponin of 0.4. EKG shows 82 beats per minute with  questionable anterior lead changes read by the computer but at this point I  am not impressed with any anterior ischemia.   ASSESSMENT:  This is a 77 year old white male with a history of anemia of  unclear etiology. Esophagogastroduodenoscopy and colonoscopy have recently  been negative after resection some time ago for a colon mass that was  benign. The patient relates subsequent transfusion since that time and  presents today with shortness of breath, weakness, and a hemoglobin of 3.7.   PLAN:  1.  Chronic blood loss anemia versus primary hematological dysfunction: The      patient has a microcystic anemia. We will transfuse packed red blood      cells and keep his hematocrit greater than 30. We will check a LDH,      reticulocyte count, iron total, TIBC, ferritin, SPEP, Epogen level, and      a peripheral smear. He likely will require a hematology consult in the      morning. We will heme check all of his stools and consider intravenous      iron versus p.o. supplementation. We will hold his aspirin.  2.  Cardiovascular:  No history of hypertension. We will check a 2-D      echocardiogram to assess his ejection fraction and wall motion for      potential end-organ damage related to chronic anemia.  3.  Pulmonary:  X-ray shows only atelectasis. Otherwise he is stable.  4.  GI:  Heme check all his stools. Obtain records from [place]. We will      start him on Protonix 40 milligrams p.o. daily and hold his aspirin.  5.  GU:  Negative urinalysis and no complaints of urinary dysfunction.  6.  Endocrine:  Diabetes. For now, I would like to hold his metformin until     we decide what studies he will need. Check CBGs and a  hemoglobin A1c as      well as use sliding scale insulin. DVT will be with PAS hose.      Melissa  Balinda Quails, MD  Electronically Signed     MLT/MEDQ  D:  04/18/2005  T:  04/19/2005  Job:  [id number]   cc:   Toy Cookey, M.D.  [place], [place]

## 2011-03-23 LAB — BASIC METABOLIC PANEL
BUN: 14
BUN: 17
BUN: 36 — ABNORMAL HIGH
CO2: 25
CO2: 26
Calcium: 8.3 — ABNORMAL LOW
Calcium: 8.3 — ABNORMAL LOW
Calcium: 8.5
Chloride: 103
Chloride: 104
Chloride: 106
Chloride: 110
Creatinine, Ser: 1.28
Creatinine, Ser: 1.29
GFR calc Af Amer: >60
GFR calc Af Amer: >60
GFR calc Af Amer: >60
GFR calc non Af Amer: 46 — ABNORMAL LOW
GFR calc non Af Amer: 51 — ABNORMAL LOW
GFR calc non Af Amer: 55 — ABNORMAL LOW
GFR calc non Af Amer: 55 — ABNORMAL LOW
GFR calc non Af Amer: >60
Glucose, Bld: 111 — ABNORMAL HIGH
Glucose, Bld: 116 — ABNORMAL HIGH
Glucose, Bld: 125 — ABNORMAL HIGH
Glucose, Bld: 165 — ABNORMAL HIGH
Potassium: 3 — ABNORMAL LOW
Potassium: 3.3 — ABNORMAL LOW
Potassium: 3.4 — ABNORMAL LOW
Potassium: 3.6
Potassium: 3.8
Sodium: 137
Sodium: 137
Sodium: 141
Sodium: 141
Sodium: 141

## 2011-03-23 LAB — COMPREHENSIVE METABOLIC PANEL
ALT: 20
AST: 17
Albumin: 2.7 — ABNORMAL LOW
Alkaline Phosphatase: 65
BUN: 41 — ABNORMAL HIGH
CO2: 27
Calcium: 8.5
Chloride: 101
Creatinine, Ser: 1.48
GFR calc Af Amer: 57 — ABNORMAL LOW
GFR calc non Af Amer: 47 — ABNORMAL LOW
Glucose, Bld: 221 — ABNORMAL HIGH
Potassium: 3.6
Sodium: 136
Total Bilirubin: 0.6
Total Protein: 5.4 — ABNORMAL LOW

## 2011-03-23 LAB — CROSSMATCH
ABO/RH(D): A POS
Antibody Screen: NEGATIVE

## 2011-03-23 LAB — HEMOGLOBIN AND HEMATOCRIT, BLOOD
HCT: 24.8 — ABNORMAL LOW
HCT: 25.9 — ABNORMAL LOW
HCT: 26 — ABNORMAL LOW
HCT: 26.2 — ABNORMAL LOW
HCT: 26.5 — ABNORMAL LOW
HCT: 26.6 — ABNORMAL LOW
HCT: 26.9 — ABNORMAL LOW
HCT: 27.2 — ABNORMAL LOW
HCT: 28.1 — ABNORMAL LOW
HCT: 29.1 — ABNORMAL LOW
HCT: 29.1 — ABNORMAL LOW
Hemoglobin: 10.4 — ABNORMAL LOW
Hemoglobin: 8.4 — ABNORMAL LOW
Hemoglobin: 8.7 — ABNORMAL LOW
Hemoglobin: 8.8 — ABNORMAL LOW
Hemoglobin: 8.9 — ABNORMAL LOW
Hemoglobin: 8.9 — ABNORMAL LOW
Hemoglobin: 8.9 — ABNORMAL LOW
Hemoglobin: 9 — ABNORMAL LOW
Hemoglobin: 9.1 — ABNORMAL LOW
Hemoglobin: 9.4 — ABNORMAL LOW
Hemoglobin: 9.8 — ABNORMAL LOW
Hemoglobin: 9.8 — ABNORMAL LOW

## 2011-03-23 LAB — CBC
HCT: 23.1 — ABNORMAL LOW
HCT: 26.7 — ABNORMAL LOW
HCT: 27.7 — ABNORMAL LOW
HCT: 27.8 — ABNORMAL LOW
HCT: 30.7 — ABNORMAL LOW
HCT: 32.2 — ABNORMAL LOW
Hemoglobin: 10.2 — ABNORMAL LOW
Hemoglobin: 10.3 — ABNORMAL LOW
Hemoglobin: 10.6 — ABNORMAL LOW
Hemoglobin: 7.8 — CL
Hemoglobin: 9.1 — ABNORMAL LOW
Hemoglobin: 9.2 — ABNORMAL LOW
MCHC: 33
MCHC: 33.1
MCHC: 33.3
MCHC: 33.8
MCHC: 33.8
MCV: 78.5
MCV: 79.2
MCV: 84.3
MCV: 84.7
MCV: 84.8
Platelets: 299
Platelets: 303
Platelets: 358
Platelets: 361
Platelets: 502 — ABNORMAL HIGH
RBC: 2.95 — ABNORMAL LOW
RBC: 3.27 — ABNORMAL LOW
RBC: 3.51 — ABNORMAL LOW
RBC: 3.61 — ABNORMAL LOW
RBC: 3.8 — ABNORMAL LOW
RDW: 18.1 — ABNORMAL HIGH
RDW: 19.3 — ABNORMAL HIGH
RDW: 19.4 — ABNORMAL HIGH
RDW: 19.6 — ABNORMAL HIGH
RDW: 21 — ABNORMAL HIGH
RDW: 21 — ABNORMAL HIGH
WBC: 10.4
WBC: 11.2 — ABNORMAL HIGH
WBC: 11.5 — ABNORMAL HIGH
WBC: 15 — ABNORMAL HIGH
WBC: 8.1

## 2011-03-23 LAB — PREPARE RBC (CROSSMATCH)

## 2011-03-24 LAB — URINALYSIS, ROUTINE W REFLEX MICROSCOPIC
Glucose, UA: 250 — AB
Ketones, ur: NEGATIVE
Specific Gravity, Urine: 1.023
pH: 5

## 2011-03-24 LAB — DIFFERENTIAL
Basophils Absolute: 0.1
Basophils Relative: 1
Eosinophils Relative: 1
Eosinophils Relative: 1
Lymphocytes Relative: 10 — ABNORMAL LOW
Lymphocytes Relative: 13
Lymphs Abs: 1.6
Lymphs Abs: 1.4
Monocytes Relative: 4
Monocytes Relative: 6
Monocytes Relative: 7
Neutro Abs: 13.6 — ABNORMAL HIGH

## 2011-03-24 LAB — CBC
HCT: 28.6 — ABNORMAL LOW
HCT: 31 — ABNORMAL LOW
HCT: 22.6 — ABNORMAL LOW
HCT: 28.8 — ABNORMAL LOW
Hemoglobin: 9.1 — ABNORMAL LOW
Hemoglobin: 10.2 — ABNORMAL LOW
Hemoglobin: 9.2 — ABNORMAL LOW
MCHC: 31.6
MCHC: 31.7
MCHC: 32.6
MCHC: 31.5
MCHC: 31.8
MCV: 71.8 — ABNORMAL LOW
MCV: 66.4 — ABNORMAL LOW
MCV: 66.5 — ABNORMAL LOW
MCV: 71.3 — ABNORMAL LOW
Platelets: 340
Platelets: 353
Platelets: 358
RBC: 2.9 — ABNORMAL LOW
RBC: 3.41 — ABNORMAL LOW
RBC: 3.92 — ABNORMAL LOW
RBC: 4.03 — ABNORMAL LOW
RBC: 4.49
RDW: 20.2 — ABNORMAL HIGH
RDW: 20.7 — ABNORMAL HIGH
RDW: 21.5 — ABNORMAL HIGH
WBC: 10.9 — ABNORMAL HIGH
WBC: 11.2 — ABNORMAL HIGH
WBC: 11.6 — ABNORMAL HIGH

## 2011-03-24 LAB — URINALYSIS, MICROSCOPIC ONLY
Bilirubin Urine: NEGATIVE
Hgb urine dipstick: NEGATIVE
Protein, ur: NEGATIVE
Urobilinogen, UA: 0.2

## 2011-03-24 LAB — APTT: aPTT: 25

## 2011-03-24 LAB — COMPREHENSIVE METABOLIC PANEL
ALT: 23
AST: 20
Alkaline Phosphatase: 113
BUN: 11
CO2: 26
CO2: 24
CO2: 27
Calcium: 9.1
Calcium: 8.7
Chloride: 107
Creatinine, Ser: 1.37
Creatinine, Ser: 1.53 — ABNORMAL HIGH
GFR calc Af Amer: 50 — ABNORMAL LOW
GFR calc Af Amer: 54 — ABNORMAL LOW
GFR calc non Af Amer: 41 — ABNORMAL LOW
GFR calc non Af Amer: 45 — ABNORMAL LOW
GFR calc non Af Amer: 51 — ABNORMAL LOW
Glucose, Bld: 223 — ABNORMAL HIGH
Potassium: 3.5
Sodium: 133 — ABNORMAL LOW
Total Bilirubin: 0.6
Total Protein: 6.1

## 2011-03-24 LAB — IRON AND TIBC
Saturation Ratios: 18 — ABNORMAL LOW
TIBC: 379
UIBC: 311

## 2011-03-24 LAB — CROSSMATCH
ABO/RH(D): A POS
Antibody Screen: NEGATIVE

## 2011-03-24 LAB — PROTIME-INR
INR: 1.1
Prothrombin Time: 13.7

## 2011-03-24 LAB — URINE CULTURE
Colony Count: NO GROWTH
Culture: NO GROWTH

## 2011-03-24 LAB — SAMPLE TO BLOOD BANK

## 2011-03-24 LAB — FOLATE: Folate: 4.6

## 2011-03-24 LAB — FERRITIN: Ferritin: 8 — ABNORMAL LOW (ref 22–322)

## 2011-12-27 ENCOUNTER — Encounter: Payer: Self-pay | Admitting: Gastroenterology

## 2018-02-02 ENCOUNTER — Other Ambulatory Visit: Payer: Self-pay

## 2018-02-02 ENCOUNTER — Inpatient Hospital Stay
Admission: EM | Admit: 2018-02-02 | Discharge: 2018-02-06 | DRG: 684 | Disposition: A | Discharge status: Home / Self Care | Payer: Medicare Other | Attending: Internal Medicine | Admitting: Internal Medicine

## 2018-02-02 DIAGNOSIS — E1122 Type 2 diabetes mellitus with diabetic chronic kidney disease: Secondary | ICD-10-CM | POA: Diagnosis present

## 2018-02-02 DIAGNOSIS — N179 Acute kidney failure, unspecified: Secondary | ICD-10-CM

## 2018-02-02 DIAGNOSIS — F17290 Nicotine dependence, other tobacco product, uncomplicated: Secondary | ICD-10-CM | POA: Diagnosis present

## 2018-02-02 DIAGNOSIS — E860 Dehydration: Secondary | ICD-10-CM | POA: Diagnosis present

## 2018-02-02 DIAGNOSIS — K649 Unspecified hemorrhoids: Secondary | ICD-10-CM | POA: Diagnosis present

## 2018-02-02 DIAGNOSIS — R21 Rash and other nonspecific skin eruption: Secondary | ICD-10-CM | POA: Diagnosis present

## 2018-02-02 DIAGNOSIS — J449 Chronic obstructive pulmonary disease, unspecified: Secondary | ICD-10-CM | POA: Diagnosis present

## 2018-02-02 DIAGNOSIS — E785 Hyperlipidemia, unspecified: Secondary | ICD-10-CM | POA: Diagnosis present

## 2018-02-02 DIAGNOSIS — N189 Chronic kidney disease, unspecified: Secondary | ICD-10-CM

## 2018-02-02 DIAGNOSIS — I129 Hypertensive chronic kidney disease with stage 1 through stage 4 chronic kidney disease, or unspecified chronic kidney disease: Secondary | ICD-10-CM | POA: Diagnosis present

## 2018-02-02 DIAGNOSIS — N183 Chronic kidney disease, stage 3 (moderate): Secondary | ICD-10-CM | POA: Diagnosis present

## 2018-02-02 DIAGNOSIS — R531 Weakness: Secondary | ICD-10-CM

## 2018-02-02 DIAGNOSIS — Z8249 Family history of ischemic heart disease and other diseases of the circulatory system: Secondary | ICD-10-CM

## 2018-02-02 DIAGNOSIS — F0390 Unspecified dementia without behavioral disturbance: Secondary | ICD-10-CM | POA: Diagnosis present

## 2018-02-02 DIAGNOSIS — E119 Type 2 diabetes mellitus without complications: Secondary | ICD-10-CM

## 2018-02-02 DIAGNOSIS — I10 Essential (primary) hypertension: Secondary | ICD-10-CM | POA: Diagnosis present

## 2018-02-02 DIAGNOSIS — Z79899 Other long term (current) drug therapy: Secondary | ICD-10-CM

## 2018-02-02 DIAGNOSIS — N289 Disorder of kidney and ureter, unspecified: Secondary | ICD-10-CM

## 2018-02-02 HISTORY — DX: Hyperlipidemia, unspecified: E78.5

## 2018-02-02 HISTORY — DX: Essential (primary) hypertension: I10

## 2018-02-02 HISTORY — DX: Chronic obstructive pulmonary disease, unspecified: J44.9

## 2018-02-02 HISTORY — DX: Type 2 diabetes mellitus without complications: E11.9

## 2018-02-02 LAB — CBC
HCT: 35.8 % — ABNORMAL LOW (ref 40.0–52.0)
HEMOGLOBIN: 11.9 g/dL — AB (ref 13.0–18.0)
MCH: 28 pg (ref 26.0–34.0)
MCHC: 33.1 g/dL (ref 32.0–36.0)
MCV: 84.5 fL (ref 80.0–100.0)
Platelets: 368 10*3/uL (ref 150–440)
RBC: 4.24 MIL/uL — AB (ref 4.40–5.90)
RDW: 18.6 % — ABNORMAL HIGH (ref 11.5–14.5)
WBC: 8.4 10*3/uL (ref 3.8–10.6)

## 2018-02-02 LAB — COMPREHENSIVE METABOLIC PANEL
ALT: 8 U/L (ref 0–44)
ANION GAP: 7 (ref 5–15)
AST: 14 U/L — ABNORMAL LOW (ref 15–41)
Albumin: 4 g/dL (ref 3.5–5.0)
Alkaline Phosphatase: 146 U/L — ABNORMAL HIGH (ref 38–126)
BUN: 27 mg/dL — ABNORMAL HIGH (ref 8–23)
CO2: 20 mmol/L — AB (ref 22–32)
Calcium: 8.9 mg/dL (ref 8.9–10.3)
Chloride: 107 mmol/L (ref 98–111)
Creatinine, Ser: 2.24 mg/dL — ABNORMAL HIGH (ref 0.61–1.24)
GFR calc non Af Amer: 25 mL/min — ABNORMAL LOW (ref >60)
GFR, EST AFRICAN AMERICAN: 29 mL/min — AB (ref >60)
Glucose, Bld: 105 mg/dL — ABNORMAL HIGH (ref 70–99)
POTASSIUM: 4.9 mmol/L (ref 3.5–5.1)
SODIUM: 134 mmol/L — AB (ref 135–145)
TOTAL PROTEIN: 7.8 g/dL (ref 6.5–8.1)
Total Bilirubin: 0.3 mg/dL (ref 0.3–1.2)

## 2018-02-02 MED ORDER — NYSTATIN 100000 UNIT/GM EX POWD: Freq: Four times a day (QID) | CUTANEOUS | 0 refills | Status: AC

## 2018-02-02 MED ORDER — SODIUM CHLORIDE 0.9 % IV BOLUS
1000.0000 mL | Freq: Once | INTRAVENOUS | Status: AC
  Administered 2018-02-02: 1000 mL via INTRAVENOUS

## 2018-02-02 MED ORDER — NYSTATIN 100000 UNIT/GM EX POWD
Freq: Once | CUTANEOUS | Status: AC
  Administered 2018-02-02: 23:00:00 via TOPICAL
  Filled 2018-02-02: qty 15

## 2018-02-02 NOTE — ED Provider Notes (Addendum)
[facility] Emergency Department Provider Note  ____________________________________________  Time seen: Approximately 9:57 PM  I have reviewed the triage vital signs and the nursing notes.   HISTORY  Chief Complaint Blood In Stools    HPI [name] [name] is a 84 y.o. male who appears demented but is here without a family member presenting for bloody stools.  The patient denies that he is having any change in his stools.  He states he has a history of hemorrhoids and has had bleeding in the past but does not have any now.  He denies any lightheadedness or syncope.  He has no pain.  He has no abdominal pain, nausea or vomiting.  History reviewed. No pertinent past medical history.  There are no active problems to display for this patient.   History reviewed. No pertinent surgical history.    Allergies Patient has no known allergies.  No family history on file.  Social History Social History   Tobacco Use  . Smoking status: Not on file  Substance Use Topics  . Alcohol use: Not on file  . Drug use: Not on file    Review of Systems  Mody due to patient dementia. Constitutional: No fever/chills.  Denies lightheadedness or syncope. ENT: No sore throat. No congestion or rhinorrhea. Cardiovascular: Denies chest pain. Denies palpitations. Respiratory: Denies shortness of breath.  No cough. Gastrointestinal: No abdominal pain.  No nausea, no vomiting.  No diarrhea.  No constipation. Genitourinary: Negative for dysuria.  Remote history of bleeding hemorrhoids, but denies any rectal pain or rectal bleeding now. Musculoskeletal: Negative for back pain. Skin: Negative for rash. Neurological: Negative for headaches. No focal numbness, tingling or weakness.     ____________________________________________   PHYSICAL EXAM:  VITAL SIGNS: ED Triage Vitals [02/02/18 1631]  Enc Vitals Group     BP (!) 140/102     Pulse Rate 75     Resp 16     Temp 98.2  F (36.8 C)     Temp Source Oral     SpO2 100 %     Weight 160 lb (72.6 kg)     Height _0  (1.626 m)     Head Circumference      Peak Flow      Pain Score 0     Pain Loc      Pain Edu?      Excl. in [place]?     Constitutional: The patient is alert and mildly demented.  He occasionally gets confused and thought the cops were at his house, but he met the paramedics.  He is able to answer most questions appropriately peer Eyes: Conjunctivae are normal without pallor.  EOMI. No scleral icterus. Head: Atraumatic. Nose: No congestion/rhinnorhea. Mouth/Throat: Mucous membranes are mildly dry.  Neck: No stridor.  Supple.   Cardiovascular: Normal rate, regular rhythm. No murmurs, rubs or gallops.  Respiratory: Normal respiratory effort.  No accessory muscle use or retractions. Lungs CTAB.  No wheezes, rales or ronchi. Gastrointestinal: Soft, nontender and nondistended.  No guarding or rebound.  No peritoneal signs. Genitourinary: Patient does have some erythema in the inguinal creases of the crotch bilaterally.  He has multiple nonbleeding nonthrombosed hemorrhoids externally and no palpable internal hemorrhoids.  His stool is brown and guaiac negative. Musculoskeletal: No LE edema. No ttp in the calves or palpable cords.  Negative Homan's sign. Neurologic:  A&Ox3.  Speech is clear.  Face and smile are symmetric.  EOMI.  Moves all extremities well. Skin:  Skin is warm, dry and intact. No rash noted. Psychiatric: Mood and affect are normal. Speech and behavior are normal.  Normal judgement.  ____________________________________________   LABS (all labs ordered are listed, but only abnormal results are displayed)  Labs Reviewed  COMPREHENSIVE METABOLIC PANEL - Abnormal; Notable for the following components:      Result Value   Sodium 134 (*)    CO2 20 (*)    Glucose, Bld 105 (*)    BUN 27 (*)    Creatinine, Ser 2.24 (*)    AST 14 (*)    Alkaline Phosphatase 146 (*)    GFR calc non Af  Amer 25 (*)    GFR calc Af Amer 29 (*)    All other components within normal limits  CBC - Abnormal; Notable for the following components:   RBC 4.24 (*)    Hemoglobin 11.9 (*)    HCT 35.8 (*)    RDW 18.6 (*)    All other components within normal limits  BASIC METABOLIC PANEL   ____________________________________________  EKG  ED ECG REPORT I, Anne-Caroline Mariea Clonts, the attending physician, personally viewed and interpreted this ECG.   Date: 02/22/2018  EKG Time: 1957  Rate: 65  Rhythm: normal sinus rhythm  Axis: normal  Intervals:first-degree A-V block   ST&T Change: No STEMI  ____________________________________________  RADIOLOGY  No results found.  ____________________________________________   PROCEDURES  Procedure(s) performed: None  Procedures  Critical Care performed: No ____________________________________________   INITIAL IMPRESSION / ASSESSMENT AND PLAN / ED COURSE  Pertinent labs & imaging results that were available during my care of the patient were reviewed by me and considered in my medical decision making (see chart for details).  84 y.o. male who was brought by EMS for reports of bloody stools but has no evidence of bleeding.  He does have some nonbleeding nonthrombosed external hemorrhoids without any palpable internal hemorrhoids, his stool is brown.  He is hemodynamically stable and his blood counts are unchanged compared to prior.  He does have a bump in his creatinine to 2.24 with dry mucous membranes; this is likely due to dehydration.  We will try to give him intravenous fluids and oral rehydration, and if his creatinine improves, he will be discharged home with instructions to drink plenty of clear liquids and have his creatinine rechecked on Monday by his primary care physician.  ____________________________________________  FINAL CLINICAL IMPRESSION(S) / ED DIAGNOSES  Final diagnoses:  Renal insufficiency  Hemorrhoids, unspecified  hemorrhoid type  Groin rash         NEW MEDICATIONS STARTED DURING THIS VISIT:  New Prescriptions   NYSTATIN (MYCOSTATIN/NYSTOP) POWDER    Apply topically 4 (four) times daily.      Eula Listen, MD 02/02/18 2204    Eula Listen, MD 02/22/18 2259

## 2018-02-02 NOTE — ED Notes (Signed)
Pt is given some oral fluids until new IV can be established.

## 2018-02-02 NOTE — ED Notes (Signed)
IV x2 attempts, unsuccessful

## 2018-02-02 NOTE — ED Triage Notes (Signed)
Pt to ER via [facility] c/o bloody stools. Pt hx of dementia and is disoriented to time currently. Pt states that he feels fine, unsure why he is here.  VSS with EMS

## 2018-02-02 NOTE — ED Notes (Signed)
Pt was brought in via EMS on dayshift, due to blood in stools. Pt has a hx of dementia and can answer questions correctly when asked. Pt stated that he woke up to cops at his house (stated that he stays with his daughter and rents a room there) and then EMS came and brought him here and he does not know why he is here. Pt asked if he was hurting he stated no and pt was asked if he had any rectal bleeding he stated no but he has a hx of that before and [facility] at [facility].

## 2018-02-02 NOTE — ED Notes (Signed)
Pt denies any complaints. Denies rectal bleeding.

## 2018-02-02 NOTE — ED Notes (Signed)
NS stopped at this time.

## 2018-02-03 ENCOUNTER — Encounter: Payer: Self-pay | Admitting: Internal Medicine

## 2018-02-03 ENCOUNTER — Emergency Department: Payer: Medicare Other

## 2018-02-03 DIAGNOSIS — I129 Hypertensive chronic kidney disease with stage 1 through stage 4 chronic kidney disease, or unspecified chronic kidney disease: Secondary | ICD-10-CM | POA: Diagnosis present

## 2018-02-03 DIAGNOSIS — J449 Chronic obstructive pulmonary disease, unspecified: Secondary | ICD-10-CM | POA: Diagnosis present

## 2018-02-03 DIAGNOSIS — E1122 Type 2 diabetes mellitus with diabetic chronic kidney disease: Secondary | ICD-10-CM | POA: Diagnosis present

## 2018-02-03 DIAGNOSIS — I10 Essential (primary) hypertension: Secondary | ICD-10-CM | POA: Diagnosis present

## 2018-02-03 DIAGNOSIS — E785 Hyperlipidemia, unspecified: Secondary | ICD-10-CM | POA: Diagnosis present

## 2018-02-03 DIAGNOSIS — E860 Dehydration: Secondary | ICD-10-CM | POA: Diagnosis present

## 2018-02-03 DIAGNOSIS — F17290 Nicotine dependence, other tobacco product, uncomplicated: Secondary | ICD-10-CM | POA: Diagnosis present

## 2018-02-03 DIAGNOSIS — R21 Rash and other nonspecific skin eruption: Secondary | ICD-10-CM | POA: Diagnosis present

## 2018-02-03 DIAGNOSIS — E119 Type 2 diabetes mellitus without complications: Secondary | ICD-10-CM

## 2018-02-03 DIAGNOSIS — Z8249 Family history of ischemic heart disease and other diseases of the circulatory system: Secondary | ICD-10-CM | POA: Diagnosis not present

## 2018-02-03 DIAGNOSIS — F0390 Unspecified dementia without behavioral disturbance: Secondary | ICD-10-CM | POA: Diagnosis present

## 2018-02-03 DIAGNOSIS — N189 Chronic kidney disease, unspecified: Secondary | ICD-10-CM

## 2018-02-03 DIAGNOSIS — Z79899 Other long term (current) drug therapy: Secondary | ICD-10-CM | POA: Diagnosis not present

## 2018-02-03 DIAGNOSIS — N179 Acute kidney failure, unspecified: Secondary | ICD-10-CM | POA: Diagnosis present

## 2018-02-03 DIAGNOSIS — K649 Unspecified hemorrhoids: Secondary | ICD-10-CM | POA: Diagnosis present

## 2018-02-03 DIAGNOSIS — N183 Chronic kidney disease, stage 3 (moderate): Secondary | ICD-10-CM | POA: Diagnosis present

## 2018-02-03 LAB — BASIC METABOLIC PANEL
ANION GAP: 6 (ref 5–15)
BUN: 25 mg/dL — ABNORMAL HIGH (ref 8–23)
CALCIUM: 8.1 mg/dL — AB (ref 8.9–10.3)
CO2: 21 mmol/L — AB (ref 22–32)
CREATININE: 1.96 mg/dL — AB (ref 0.61–1.24)
Chloride: 107 mmol/L (ref 98–111)
GFR, EST AFRICAN AMERICAN: 35 mL/min — AB (ref >60)
GFR, EST NON AFRICAN AMERICAN: 30 mL/min — AB (ref >60)
Glucose, Bld: 80 mg/dL (ref 70–99)
Potassium: 4.5 mmol/L (ref 3.5–5.1)
Sodium: 134 mmol/L — ABNORMAL LOW (ref 135–145)

## 2018-02-03 LAB — URINALYSIS, COMPLETE (UACMP) WITH MICROSCOPIC
BILIRUBIN URINE: NEGATIVE
Glucose, UA: NEGATIVE mg/dL
Hgb urine dipstick: NEGATIVE
KETONES UR: NEGATIVE mg/dL
Leukocytes, UA: NEGATIVE
NITRITE: NEGATIVE
Protein, ur: NEGATIVE mg/dL
SPECIFIC GRAVITY, URINE: 1.003 — AB (ref 1.005–1.030)
SQUAMOUS EPITHELIAL / LPF: NONE SEEN (ref 0–5)
pH: 5 (ref 5.0–8.0)

## 2018-02-03 LAB — AMMONIA: Ammonia: 9 umol/L (ref 9–35)

## 2018-02-03 LAB — CBC WITH DIFFERENTIAL/PLATELET
Basophils Absolute: 0.1 10*3/uL (ref 0–0.1)
Basophils Relative: 1 %
EOS PCT: 3 %
Eosinophils Absolute: 0.2 10*3/uL (ref 0–0.7)
HCT: 34.5 % — ABNORMAL LOW (ref 40.0–52.0)
Hemoglobin: 11.4 g/dL — ABNORMAL LOW (ref 13.0–18.0)
LYMPHS ABS: 1.2 10*3/uL (ref 1.0–3.6)
LYMPHS PCT: 16 %
MCH: 27.7 pg (ref 26.0–34.0)
MCHC: 33 g/dL (ref 32.0–36.0)
MCV: 83.9 fL (ref 80.0–100.0)
MONO ABS: 0.5 10*3/uL (ref 0.2–1.0)
Monocytes Relative: 7 %
Neutro Abs: 5.6 10*3/uL (ref 1.4–6.5)
Neutrophils Relative %: 73 %
PLATELETS: 348 10*3/uL (ref 150–440)
RBC: 4.12 MIL/uL — AB (ref 4.40–5.90)
RDW: 18.8 % — ABNORMAL HIGH (ref 11.5–14.5)
WBC: 7.6 10*3/uL (ref 3.8–10.6)

## 2018-02-03 LAB — COMPREHENSIVE METABOLIC PANEL
ALBUMIN: 3.9 g/dL (ref 3.5–5.0)
ALT: 6 U/L (ref 0–44)
AST: 16 U/L (ref 15–41)
Alkaline Phosphatase: 159 U/L — ABNORMAL HIGH (ref 38–126)
Anion gap: 4 — ABNORMAL LOW (ref 5–15)
BUN: 21 mg/dL (ref 8–23)
CHLORIDE: 108 mmol/L (ref 98–111)
CO2: 22 mmol/L (ref 22–32)
Calcium: 8.7 mg/dL — ABNORMAL LOW (ref 8.9–10.3)
Creatinine, Ser: 1.94 mg/dL — ABNORMAL HIGH (ref 0.61–1.24)
GFR calc Af Amer: 35 mL/min — ABNORMAL LOW (ref >60)
GFR calc non Af Amer: 30 mL/min — ABNORMAL LOW (ref >60)
GLUCOSE: 88 mg/dL (ref 70–99)
Potassium: 5.1 mmol/L (ref 3.5–5.1)
SODIUM: 134 mmol/L — AB (ref 135–145)
Total Bilirubin: 0.2 mg/dL — ABNORMAL LOW (ref 0.3–1.2)
Total Protein: 7.7 g/dL (ref 6.5–8.1)

## 2018-02-03 LAB — TROPONIN I: Troponin I: <0.03 ng/mL (ref <0.03)

## 2018-02-03 LAB — GLUCOSE, CAPILLARY: Glucose-Capillary: 82 mg/dL (ref 70–99)

## 2018-02-03 MED ORDER — ONDANSETRON HCL 4 MG/2ML IJ SOLN
4.0000 mg | Freq: Four times a day (QID) | INTRAMUSCULAR | Status: DC | PRN
  Administered 2018-02-04: 09:00:00 4 mg via INTRAVENOUS
  Filled 2018-02-03: qty 2

## 2018-02-03 MED ORDER — NYSTATIN 100000 UNIT/GM EX POWD
Freq: Two times a day (BID) | CUTANEOUS | Status: DC
  Administered 2018-02-04 – 2018-02-06 (×4): via TOPICAL
  Filled 2018-02-03: qty 15

## 2018-02-03 MED ORDER — INSULIN ASPART 100 UNIT/ML ~~LOC~~ SOLN: 0.0000 [IU] | Freq: Every day | SUBCUTANEOUS | Status: DC

## 2018-02-03 MED ORDER — ACETAMINOPHEN 650 MG RE SUPP: 650.0000 mg | Freq: Four times a day (QID) | RECTAL | Status: DC | PRN

## 2018-02-03 MED ORDER — INSULIN ASPART 100 UNIT/ML ~~LOC~~ SOLN
0.0000 [IU] | Freq: Three times a day (TID) | SUBCUTANEOUS | Status: DC
  Administered 2018-02-05: 1 [IU] via SUBCUTANEOUS
  Filled 2018-02-03: qty 1

## 2018-02-03 MED ORDER — ONDANSETRON HCL 4 MG PO TABS: 4.0000 mg | ORAL_TABLET | Freq: Four times a day (QID) | ORAL | Status: DC | PRN

## 2018-02-03 MED ORDER — SODIUM CHLORIDE 0.9 % IV SOLN
INTRAVENOUS | Status: DC
  Administered 2018-02-03 – 2018-02-04 (×2): via INTRAVENOUS

## 2018-02-03 MED ORDER — HEPARIN SODIUM (PORCINE) 5000 UNIT/ML IJ SOLN
5000.0000 [IU] | Freq: Three times a day (TID) | INTRAMUSCULAR | Status: DC
  Administered 2018-02-04: 05:00:00 5000 [IU] via SUBCUTANEOUS
  Filled 2018-02-03: qty 1

## 2018-02-03 MED ORDER — ALBUTEROL SULFATE (2.5 MG/3ML) 0.083% IN NEBU: 3.0000 mL | INHALATION_SOLUTION | Freq: Four times a day (QID) | RESPIRATORY_TRACT | Status: DC | PRN

## 2018-02-03 MED ORDER — ACETAMINOPHEN 325 MG PO TABS: 650.0000 mg | ORAL_TABLET | Freq: Four times a day (QID) | ORAL | Status: DC | PRN

## 2018-02-03 NOTE — ED Notes (Signed)
Pt is resting in bed. Respirations even/unlabored. Nadn.   

## 2018-02-03 NOTE — ED Notes (Signed)
Pt assisted with urinal, pt is comfortable, pleasant, pt asks for help with remote. Now enjoying TV show. Call light in reach.

## 2018-02-03 NOTE — Progress Notes (Signed)
LCSW called APS to find out when they will meet with patient. LCSW called sister Leata MouseCheryl Oats and requested the patiwntr return home  [facility] patient but does not have a bed. In discussion with patient he [place] home [place].   Awaiting call back from sister and APS  Shandy Checo LCSW [phone]

## 2018-02-03 NOTE — H&P (Signed)
[facility] [place] [facility]   PATIENT NAME: [name] [name]    MR#:  [id number]  DATE OF BIRTH:  [birth year]  DATE OF ADMISSION:  02/02/2018  PRIMARY CARE PHYSICIAN: [facility]   REQUESTING/REFERRING PHYSICIAN: Rifenbark, MD  CHIEF COMPLAINT:   Chief Complaint  Patient presents with  . Blood In Stools    HISTORY OF PRESENT ILLNESS:  Franz Svec  is a 84 y.o. male who presents with chief complaint as above.  Patient initially presented to the ED yesterday from home with family member with a report of blood in stool.  Work-up here did not show any significant active GI bleed, likely the blood was from hemorrhoids.  However, the patient did present somewhat covered in fecal matter.  The report is that he had bottles of urine around the room in his home.  Family member state that due to his dementia he has become very difficult to care for.  On work-up in the ED he does have acute kidney injury.  Hospitalist were called for admission  PAST MEDICAL HISTORY:   Past Medical History:  Diagnosis Date  . COPD (chronic obstructive pulmonary disease) (HCC)   . Diabetes (HCC)   . HLD (hyperlipidemia)   . HTN (hypertension)      PAST SURGICAL HISTORY:   Past Surgical History:  Procedure Laterality Date  . COLON SURGERY       SOCIAL HISTORY:   Social History   Tobacco Use  . Smoking status: Current Some Day Smoker    Types: Cigars  Substance Use Topics  . Alcohol use: Not Currently     FAMILY HISTORY:   Family History  Problem Relation Age of Onset  . Hypertension Mother   . Stroke Mother      DRUG ALLERGIES:  No Known Allergies  MEDICATIONS AT HOME:   Prior to Admission medications   Medication Sig Start Date End Date Taking? Authorizing Provider  nystatin (MYCOSTATIN/NYSTOP) powder Apply topically 4 (four) times daily. 02/02/18   Rockne Menghini, MD    REVIEW OF SYSTEMS:  Review of Systems  Constitutional:  Negative for chills, fever, malaise/fatigue and weight loss.  HENT: Negative for ear pain, hearing loss and tinnitus.   Eyes: Negative for blurred vision, double vision, pain and redness.  Respiratory: Negative for cough, hemoptysis and shortness of breath.   Cardiovascular: Negative for chest pain, palpitations, orthopnea and leg swelling.  Gastrointestinal: Negative for abdominal pain, constipation, diarrhea, nausea and vomiting.  Genitourinary: Negative for dysuria, frequency and hematuria.  Musculoskeletal: Negative for back pain, joint pain and neck pain.  Skin:       No acne, rash, or lesions  Neurological: Negative for dizziness, tremors, focal weakness and weakness.  Endo/Heme/Allergies: Negative for polydipsia. Does not bruise/bleed easily.  Psychiatric/Behavioral: Negative for depression. The patient is not nervous/anxious and does not have insomnia.      VITAL SIGNS:   Vitals:   02/03/18 0830 02/03/18 1830 02/03/18 1900 02/03/18 2000  BP: 109/78 (!) 119/56 (!) 103/49   Pulse: 68 61  85  Resp:    18  Temp:    98.2 F (36.8 C)  TempSrc:    Oral  SpO2: 98% 92%  94%  Weight:      Height:       Wt Readings from Last 3 Encounters:  02/02/18 72.6 kg    PHYSICAL EXAMINATION:  Physical Exam  Vitals reviewed. Constitutional: He appears well-developed and well-nourished. No distress.  HENT:  Head: Normocephalic and atraumatic.  Dry mucous membranes  Eyes: Pupils are equal, round, and reactive to light. Conjunctivae and EOM are normal. No scleral icterus.  Neck: Normal range of motion. Neck supple. No JVD present. No thyromegaly present.  Cardiovascular: Normal rate, regular rhythm and intact distal pulses. Exam reveals no gallop and no friction rub.  No murmur heard. Respiratory: Effort normal and breath sounds normal. No respiratory distress. He has no wheezes. He has no rales.  GI: Soft. Bowel sounds are normal. He exhibits no distension. There is no tenderness.   Musculoskeletal: Normal range of motion. He exhibits no edema.  No arthritis, no gout  Lymphadenopathy:    He has no cervical adenopathy.  Neurological: He is alert. No cranial nerve deficit.  No dysarthria, no aphasia, oriented only to person and place  Skin: Skin is warm and dry. No rash noted. No erythema.  Psychiatric:  Unable to fully assess due to dementia    LABORATORY PANEL:   CBC Recent Labs  Lab 02/03/18 1942  WBC 7.6  HGB 11.4*  HCT 34.5*  PLT 348   ------------------------------------------------------------------------------------------------------------------  Chemistries  Recent Labs  Lab 02/03/18 1942  NA 134*  K 5.1  CL 108  CO2 22  GLUCOSE 88  BUN 21  CREATININE 1.94*  CALCIUM 8.7*  AST 16  ALT 6  ALKPHOS 159*  BILITOT 0.2*   ------------------------------------------------------------------------------------------------------------------  Cardiac Enzymes Recent Labs  Lab 02/03/18 1942  TROPONINI <0.03   ------------------------------------------------------------------------------------------------------------------  RADIOLOGY:  Ct Head Wo Contrast  Result Date: 02/03/2018 CLINICAL DATA:  Altered mental status, memory loss. EXAM: CT HEAD WITHOUT CONTRAST TECHNIQUE: Contiguous axial images were obtained from the base of the skull through the vertex without intravenous contrast. COMPARISON:  None. FINDINGS: Brain: Generalized age related parenchymal volume loss with commensurate dilatation of the ventricles and sulci. Chronic small vessel ischemic changes within the deep periventricular white matter regions bilaterally. No mass, hemorrhage, edema or other evidence of acute parenchymal abnormality. No extra-axial hemorrhage. Vascular: Chronic calcified atherosclerotic changes of the large vessels at the skull base. No unexpected hyperdense vessel. Skull: Normal. Negative for fracture or focal lesion. Sinuses/Orbits: No acute findings. Other:  None. IMPRESSION: 1. No acute findings.  No intracranial mass, hemorrhage or edema. 2. Generalized age related parenchymal volume loss. 3. Chronic small vessel ischemic changes within the white matter. Electronically Signed   By: Bary RichardStan  Maynard M.D.   On: 02/03/2018 19:40   Dg Chest Port 1 View  Result Date: 02/03/2018 CLINICAL DATA:  Shortness of breath EXAM: PORTABLE CHEST 1 VIEW COMPARISON:  03/27/2007 FINDINGS: Lungs are clear.  No pleural effusion or pneumothorax. The heart is normal in size. IMPRESSION: No evidence of acute cardiopulmonary disease. Electronically Signed   By: Charline BillsSriyesh  Krishnan M.D.   On: 02/03/2018 19:51    EKG:   Orders placed or performed during the hospital encounter of 02/02/18  . ED EKG  . ED EKG  . EKG 12-Lead  . EKG 12-Lead    IMPRESSION AND PLAN:  Principal Problem:   Acute kidney injury superimposed on CKD (HCC) -aggressive IV fluids for hydration, avoid nephrotoxins and monitor renal function for improvement Active Problems:   Dehydration - aggressive IV fluids as above   Diabetes (HCC) -sliding scale insulin with corresponding glucose checks, the patient's glucose is currently within the normal range.   HTN (hypertension) -patient is currently normotensive, monitor blood pressure and treat as necessary to keep below 160/100   COPD (chronic obstructive  pulmonary disease) (HCC) -albuterol inhaler as needed  Social work consult as this patient likely needs placement  Chart review performed and case discussed with ED provider. Labs, imaging and/or ECG reviewed by provider and discussed with patient/family. Management plans discussed with the patient and/or family.  DVT PROPHYLAXIS: SubQ heparin  GI PROPHYLAXIS:  None  ADMISSION STATUS: Inpatient     CODE STATUS: Full  TOTAL TIME TAKING CARE OF THIS PATIENT: 45 minutes.   Aneshia Jacquet FIELDING 02/03/2018, 8:55 PM  [facility]  Office  [phone]  CC: Primary care physician;  [facility]  Note:  This document was prepared using Dragon voice recognition software and may include unintentional dictation errors.

## 2018-02-03 NOTE — Progress Notes (Signed)
LCSW received a call from patients daughter [name] [name] Her new number is [phone] LCSW called her back to collect information to complete assessment and Fl2.  Patients daughter refused to allow him to return to the house as he has damaged the room and carpeting. She would like him to go to Nursing home/Memory care ALF.  Delta Air LinesClaudine English Tomer LCSW [phone]

## 2018-02-03 NOTE — ED Notes (Signed)
Offered patient breakfast and liquids. Patient reported he did not want anything at this time

## 2018-02-03 NOTE — ED Notes (Signed)
[phone] was called as well and no answer.

## 2018-02-03 NOTE — ED Notes (Signed)
Family here to pick up [name] [name] but he was very uncooperative and would not follow any of the instructions. He would not get up and would not answer his daughter. Grandson very concerned about bringing him home. States pts room at home is not in any state of living condition due to pts confusion and inability to care for himself. Spoke to EDP. Daughter [name] [phone]

## 2018-02-03 NOTE — ED Notes (Signed)
Pt is given a boxed lunch. 

## 2018-02-03 NOTE — ED Notes (Signed)
Patient daughter Chip BoerVicki at [phone] and no one answered.

## 2018-02-03 NOTE — ED Notes (Signed)
Pt is unsure of his actual address and he did state that Chip BoerVicki was his daughter but she is not the one he lived with.

## 2018-02-03 NOTE — Plan of Care (Signed)
Received a newly admitted Patient from the ED with a diagnosis of AKI. Routine admission care provided. Safety measures in place. Oriented to his sorroundings. Verbalized compliance with safety measures and plan of care. Needs attended. Will continue to monitor.

## 2018-02-03 NOTE — ED Notes (Signed)
Called the number [phone] and a male answered and I asked to speak to the family of Mr. [name] [name] and she stated I have the wrong number

## 2018-02-03 NOTE — ED Notes (Signed)
Pt resting, eyes closed, no distress, WCTM

## 2018-02-03 NOTE — ED Notes (Signed)
[phone] was called again, and it rung one time and it went to voicemail. A voicemail was left earlier.

## 2018-02-03 NOTE — [place] FL2 (Addendum)
  [place] MEDICAID FL2 LEVEL OF CARE SCREENING TOOL     IDENTIFICATION  Patient Name: [name] [name] Birthdate: 11-Nov-1932 Sex: male Admission Date (Current Location): 02/02/2018  [place]   Facility and Address:  [facility], [address], [place], [place] [address]      Provider Number: [id number]  Attending Physician Name and Address:  No att. providers found  Relative Name and Phone Number:     [name] ChromanCheryl Oats/ Vickie [name]  Current Level of Care: Home Recommended Level of Care: Memory Care, Family Care Home Prior Approval Number:  RSVP Number [id number]  Date Approved/Denied:   PASRR Number:    Discharge Plan: Home(other facility if bed available)    Current Diagnoses: There are no active problems to display for this patient.   Orientation RESPIRATION BLADDER Height & Weight     Self  Normal Continent Weight: 160 lb (72.6 kg) Height:  5\' 4"  (162.6 cm)  BEHAVIORAL SYMPTOMS/MOOD NEUROLOGICAL BOWEL NUTRITION STATUS      Continent Diet(Normal)  AMBULATORY STATUS COMMUNICATION OF NEEDS Skin   Independent Verbally                         Personal Care Assistance Level of Assistance  Bathing, Feeding, Dressing, Total care Bathing Assistance: Limited assistance Feeding assistance: Independent Dressing Assistance: Limited assistance Total Care Assistance: Limited assistance   Functional Limitations Info  Sight, Hearing, Speech Sight Info: Adequate Hearing Info: Adequate Speech Info: Adequate    SPECIAL CARE FACTORS FREQUENCY                       Contractures Contractures Info: Not present    Additional Factors Info                  Current Medications (02/03/2018):  This is the current hospital active medication list No current facility-administered medications for this encounter.    Current Outpatient Medications  Medication Sig Dispense Refill  . nystatin (MYCOSTATIN/NYSTOP) powder  Apply topically 4 (four) times daily. 15 g 0     Discharge Medications: Please see discharge summary for a list of discharge medications.  Relevant Imaging Results:  Relevant Lab Results:   Additional Information SSN [id number]  Cheron SchaumannBandi, [name] [name], [place]

## 2018-02-03 NOTE — Progress Notes (Signed)
LCSW received a call from patients daughter Rayetta HumphreyCheryl Oaks- who was not forthcoming during interview. Patient has been staying in a room in her house. She was informed APS has been called because she is not able to bring patient and leave him here and it was explained he was discharged.  LCSW will complete assessment and Fl2 and send his information to Memory care facilities and she can follow up with them herself.  LCSW agreed to send out information on the families behalf and will advise her in a few hours.  Delta Air LinesClaudine Nasire Reali LCSW [phone]

## 2018-02-03 NOTE — Progress Notes (Signed)
LCSW consulted with EDP and ED Charge nurse. Patient has been discharged. APS deem this patient non emergency and will see him in the home on Monday. LCSW [name] call [place].   LCSW spoke with patients daughter and explained the patient is discharged and she was informed he will need to be picked up by 7 pm today. Several suggestions were provided to daughter and she will attempt to reach her other sister [name] [name].  LCSW put together several hand outs for family for personal support workers [name] [name] and ALF and memory care units for the patients family to call. LCSW sent out information via the HUB as requested by family.  LCSW informed all ed staff of action plan.  Delta Air LinesClaudine Lorrine Killilea LCSW [phone]

## 2018-02-03 NOTE — Progress Notes (Signed)
LCSW received a call from APS coverage worker at [facility] [place] [facility] [facility] she will have an APS worker call me back, information was shared and she will call this worker back once information will be collected.  Delta Air LinesClaudine Lateesha Bezold LCSW [phone]

## 2018-02-03 NOTE — ED Provider Notes (Signed)
The patient's family came to bedside to bring him home and they were clearly not capable of providing safe care.  I rechecked the patient's labs and his creatinine remains significantly elevated.  Head CT is unremarkable and other etiology of altered mental status not elucidated.  At this point as the patient has an acute medical issue of acute kidney injury and dehydration he requires inpatient admission for continued care and management.  I discussed the hospitalist who has graciously agreed to admit the patient to his service.   Merrily Brittleifenbark, Deneene Tarver, MD 02/03/18 2057

## 2018-02-03 NOTE — Progress Notes (Signed)
LCSW was consulted with ED RN and a 3rd party [facility] After hours line was called and this worker requested a wellness check at the residence of Mr Newman PiesBall in attempts to Locate patients daughter Larene BeachVickie.  Patient is oriented x1 ( dementia) unable to complete assessment  As per EDRN this man was found with several bottles filled with urine in his bedroom and fecal matter on his person ( reported from EMS) It was unclear if they called APS so this worker did so.  LCSW awaiting call back from APS and wellness check police.  Nanda Bittick LCSW [phone]

## 2018-02-03 NOTE — ED Notes (Signed)
Lab was called to collect blood sample on pt.

## 2018-02-03 NOTE — ED Notes (Signed)
Updated pt on daughter suppose to pick him up, pt verbalizes understanding and wants to go home.

## 2018-02-03 NOTE — ED Notes (Signed)
[phone] was called with no answer. Unable to leave daughter Larene BeachVickie a message due to mailbox being full

## 2018-02-03 NOTE — ED Provider Notes (Signed)
-----------------------------------------   3:18 AM on 02/03/2018 -----------------------------------------   Blood pressure 133/75, pulse 79, temperature 98.2 F (36.8 C), temperature source Oral, resp. rate (!) 23, height 5\' 4"  (1.626 m), weight 72.6 kg, SpO2 100 %.  Assuming care from Dr. Sharma CovertNorman.  In short, [name] [name] is a 84 y.o. male with a chief complaint of Blood In Stools .  Refer to the original H&P for additional details.  The current plan of care is to follow up the results of the repeat BMP after the patient receives his fluids..    The patient's repeat BMP returned.  His creatinine is 1.96.  He will be discharged to follow-up with his primary care physician.   Rebecka ApleyWebster, Pamella Samons P, MD 02/03/18 [phone]

## 2018-02-03 NOTE — ED Notes (Signed)
Call to give report, 1C RN busy and will call back

## 2018-02-03 NOTE — Clinical Social Work Note (Signed)
Clinical Social Work Assessment  Patient Details  Name: [name] HalstedBobby [name] MRN: [id number] Date of Birth: September 22, 1932  Date of referral:  02/03/18               Reason for consult:  Abuse/Neglect, Housing Concerns/Homelessness                Permission sought to share information with:  Family Supports Permission granted to share information::  Yes, Verbal Permission Granted  Name::     [name] HumphreyCheryl [name] [phone]  Agency::     Relationship::     Contact Information:     Housing/Transportation Living arrangements for the past 2 months:  [facility](Rents room and lives with his daughter) Source of Information:  Patient, Adult Children Patient Interpreter Needed:  None Criminal Activity/Legal Involvement Pertinent to Current Situation/Hospitalization:  No - Comment as needed Significant Relationships:  Adult Children Lives with:  Adult Children Do you feel safe going back to the place where you live?  No Need for family participation in patient care:  No (Coment)  Care giving concerns: Daughter says he wont take meds,bath and only eats junk   Office managerocial Worker assessment / plan:  LCSW introduced myself to patient and he was calm and cooperative. He reports he has to pee in bottles because the bathroom door is blocked off. Patient does not want to go to facility but understands he needs help. LCSW contacted daughter [name] MayhewCherly [name]  [phone] She didn't admit he was staying with her until further investigation. She was advised APS was called and that she is to take care of her dad until a another housing arrangement could be arranged. LCSW agreed to complete Fl2 and assessment and send out patient information and put her contact information on documentation. Daughter reports her dad has dementia and wants him in a facility. LCSW provided information to daughter and she [facility]. LCSW explained HUB information but she would need to care for him till they found suitable  housing.  Patient has medicare/medicaid and received 880.00 approx a month.  Employment status:  Retired Health and safety inspectornsurance information:  Armed forces operational officerMedicare, Medicaid [place] PT Recommendations:   Not assessed Information / Ref[facility] resources[facility]   [facility], [facility]  Patient/Family's Response to care: I would do better somewhere house  Patient/Family's Understanding of and Emotional Response to Diagnosis, Current Treatment, and Prognosis:  Good understanding  Emotional Assessment Appearance:  Disheveled Attitude/Demeanor/Rapport:  Gracious, Unable to Assess(Patient has dementia) Affect (typically observed):  Accepting, Calm Orientation:  Oriented to Self, Oriented to Situation Alcohol / Substance use:  Not Applicable Psych involvement (Current and /or in the community):  No (Comment)  Discharge Needs  Concerns to be addressed:  Care Coordination, Basic Needs Readmission within the last 30 days:  No Current discharge risk:  Homeless Barriers to Discharge:  Family Issues   [name] SchaumannBandi, [name] Goodhart M, LCSW 02/03/2018, 11:09 AM

## 2018-02-04 DIAGNOSIS — R531 Weakness: Secondary | ICD-10-CM

## 2018-02-04 LAB — GLUCOSE, CAPILLARY
GLUCOSE-CAPILLARY: 70 mg/dL (ref 70–99)
GLUCOSE-CAPILLARY: 94 mg/dL (ref 70–99)
Glucose-Capillary: 85 mg/dL (ref 70–99)
Glucose-Capillary: 116 mg/dL — ABNORMAL HIGH (ref 70–99)

## 2018-02-04 LAB — CBC
HCT: 31.3 % — ABNORMAL LOW (ref 40.0–52.0)
Hemoglobin: 10.5 g/dL — ABNORMAL LOW (ref 13.0–18.0)
MCH: 28 pg (ref 26.0–34.0)
MCHC: 33.6 g/dL (ref 32.0–36.0)
MCV: 83.4 fL (ref 80.0–100.0)
PLATELETS: 299 10*3/uL (ref 150–440)
RBC: 3.76 MIL/uL — AB (ref 4.40–5.90)
RDW: 18.2 % — ABNORMAL HIGH (ref 11.5–14.5)
WBC: 7.6 10*3/uL (ref 3.8–10.6)

## 2018-02-04 LAB — BASIC METABOLIC PANEL
ANION GAP: 4 — AB (ref 5–15)
BUN: 19 mg/dL (ref 8–23)
CHLORIDE: 108 mmol/L (ref 98–111)
CO2: 23 mmol/L (ref 22–32)
Calcium: 8.5 mg/dL — ABNORMAL LOW (ref 8.9–10.3)
Creatinine, Ser: 1.84 mg/dL — ABNORMAL HIGH (ref 0.61–1.24)
GFR calc non Af Amer: 32 mL/min — ABNORMAL LOW (ref >60)
GFR, EST AFRICAN AMERICAN: 37 mL/min — AB (ref >60)
Glucose, Bld: 78 mg/dL (ref 70–99)
Potassium: 5 mmol/L (ref 3.5–5.1)
SODIUM: 135 mmol/L (ref 135–145)

## 2018-02-04 LAB — HEMOGLOBIN A1C
Hgb A1c MFr Bld: 5.4 % (ref 4.8–5.6)
MEAN PLASMA GLUCOSE: 108.28 mg/dL

## 2018-02-04 NOTE — Progress Notes (Signed)
LCSW received an RSVP number for this patient   [phone]  From DSS should patient wish to [facility] FL2 has been adjusted   Sanvika Cuttino Fl[place]orgeBandi LCSW [phone]

## 2018-02-04 NOTE — Progress Notes (Signed)
[facility] - [place] [facility]   PATIENT NAME: [name] HalstedBobby [name]    MR#:  [id number]  DATE OF BIRTH:  13-Jun-1933  SUBJECTIVE:  CHIEF COMPLAINT:   Chief Complaint  Patient presents with  . Blood In Stools   Presented to the emergency room for bright red blood per rectum which is thought to be hemorrhoidal.  No further bleeding in the hospital.  Has dementia.  Admitted for weakness and placement.  REVIEW OF SYSTEMS:    Review of Systems  Unable to perform ROS: Dementia    DRUG ALLERGIES:  No Known Allergies  VITALS:  Blood pressure (!) 112/50, pulse 76, temperature 97.8 F (36.6 C), temperature source Oral, resp. rate 19, height 5\' 3"  (1.6 m), weight 65.6 kg, SpO2 100 %.  PHYSICAL EXAMINATION:   Physical Exam  GENERAL:  84 y.o.-year-old patient lying in the bed with no acute distress.  EYES: Pupils equal, round, reactive to light and accommodation. No scleral icterus. Extraocular muscles intact.  HEENT: Head atraumatic, normocephalic. Oropharynx and nasopharynx clear.  NECK:  Supple, no jugular venous distention. No thyroid enlargement, no tenderness.  LUNGS: Normal breath sounds bilaterally, no wheezing, rales, rhonchi. No use of accessory muscles of respiration.  CARDIOVASCULAR: S1, S2 normal. No murmurs, rubs, or gallops.  ABDOMEN: Soft, nontender, nondistended. Bowel sounds present. No organomegaly or mass.  EXTREMITIES: No cyanosis, clubbing or edema b/l.    NEUROLOGIC: Cranial nerves II through XII are intact. No focal Motor or sensory deficits b/l.   PSYCHIATRIC: The patient is alert and awake SKIN: No obvious rash, lesion, or ulcer.   LABORATORY PANEL:   CBC Recent Labs  Lab 02/04/18 0506  WBC 7.6  HGB 10.5*  HCT 31.3*  PLT 299   ------------------------------------------------------------------------------------------------------------------ Chemistries  Recent Labs  Lab 02/03/18 1942 02/04/18 0506  NA 134* 135  K 5.1 5.0  CL 108 108   CO2 22 23  GLUCOSE 88 78  BUN 21 19  CREATININE 1.94* 1.84*  CALCIUM 8.7* 8.5*  AST 16  --   ALT 6  --   ALKPHOS 159*  --   BILITOT 0.2*  --    ------------------------------------------------------------------------------------------------------------------  Cardiac Enzymes Recent Labs  Lab 02/03/18 1942  TROPONINI <0.03   ------------------------------------------------------------------------------------------------------------------  RADIOLOGY:  Ct Head Wo Contrast  Result Date: 02/03/2018 CLINICAL DATA:  Altered mental status, memory loss. EXAM: CT HEAD WITHOUT CONTRAST TECHNIQUE: Contiguous axial images were obtained from the base of the skull through the vertex without intravenous contrast. COMPARISON:  None. FINDINGS: Brain: Generalized age related parenchymal volume loss with commensurate dilatation of the ventricles and sulci. Chronic small vessel ischemic changes within the deep periventricular white matter regions bilaterally. No mass, hemorrhage, edema or other evidence of acute parenchymal abnormality. No extra-axial hemorrhage. Vascular: Chronic calcified atherosclerotic changes of the large vessels at the skull base. No unexpected hyperdense vessel. Skull: Normal. Negative for fracture or focal lesion. Sinuses/Orbits: No acute findings. Other: None. IMPRESSION: 1. No acute findings.  No intracranial mass, hemorrhage or edema. 2. Generalized age related parenchymal volume loss. 3. Chronic small vessel ischemic changes within the white matter. Electronically Signed   By: Bary RichardStan  Maynard M.D.   On: 02/03/2018 19:40   Dg Chest Port 1 View  Result Date: 02/03/2018 CLINICAL DATA:  Shortness of breath EXAM: PORTABLE CHEST 1 VIEW COMPARISON:  03/27/2007 FINDINGS: Lungs are clear.  No pleural effusion or pneumothorax. The heart is normal in size. IMPRESSION: No evidence of acute cardiopulmonary disease. Electronically Signed  By: Charline Bills M.D.   On: 02/03/2018 19:51      ASSESSMENT AND PLAN:   *Bright red blood per rectum.  Hemoglobin stable.  Likely hemorrhoidal.  No further work-up at this time. No further bleeding in the hospital.  *Dementia.  Monitor for inpatient delirium.  *Diabetes mellitus.  Check hemoglobin A1c.  Continue sliding scale insulin.  Blood sugars in the normal range at this time.  *CKD stage III is stable.  No acute kidney injury.  *DVT prophylaxis.  SCDs.  Hold heparin due to concern for blood in stool.  All the records are reviewed and case discussed with Care Management/Social Worker Management plans discussed with the patient, family and they are in agreement.  CODE STATUS: FULL CODE  DVT Prophylaxis: SCDs  TOTAL TIME TAKING CARE OF THIS PATIENT: 35 minutes.   POSSIBLE D/C IN 1-2 DAYS, DEPENDING ON CLINICAL CONDITION.  Molinda Bailiff Moksha Dorgan M.D on 02/04/2018 at 10:30 AM  Between 7am to 6pm - Pager - [phone]  After 6pm go to [email or web address] - password [email or web address]  [facility] [place] [facility]  Office  [phone]  CC: Primary care physician; [facility]  Note: This dictation was prepared with Dragon dictation along with smaller phrase technology. Any transcriptional errors that result from this process are unintentional.

## 2018-02-04 NOTE — Care Management Obs Status (Signed)
MEDICARE OBSERVATION STATUS NOTIFICATION   Patient Details  Name: [name] HalstedBobby [name] MRN: [id number] Date of Birth: 1933-04-05   Medicare Observation Status Notification Given:  Yes    Selestino Nila A Savana Spina, RN 02/04/2018, 9:09 AM

## 2018-02-04 NOTE — Progress Notes (Signed)
LCSW reviewed notes from ED staff and will know send all information out via the HUB for [facility] and [facility]. The patient specifically told me he does not want to be placed in a facility but rather live in a boarding house.   As per his daughter wishes- Information has been [facility] to [facility] and other options via the hub.   Delta Air LinesClaudine Hanna Aultman LCSW [phone]

## 2018-02-05 LAB — GLUCOSE, CAPILLARY
GLUCOSE-CAPILLARY: 132 mg/dL — AB (ref 70–99)
GLUCOSE-CAPILLARY: 150 mg/dL — AB (ref 70–99)
Glucose-Capillary: 78 mg/dL (ref 70–99)
Glucose-Capillary: 87 mg/dL (ref 70–99)

## 2018-02-05 MED ORDER — LORAZEPAM 0.5 MG PO TABS: 0.5000 mg | ORAL_TABLET | Freq: Two times a day (BID) | ORAL | 0 refills | Status: AC | PRN

## 2018-02-05 NOTE — Plan of Care (Signed)
  Problem: Education: Goal: Knowledge of General Education information will improve Description Including pain rating scale, medication(s)/side effects and non-pharmacologic comfort measures Outcome: Progressing   Problem: Health Behavior/Discharge Planning: Goal: Ability to manage health-related needs will improve Outcome: Progressing   Problem: Clinical Measurements: Goal: Ability to maintain clinical measurements within normal limits will improve Outcome: Progressing Goal: Will remain free from infection Outcome: Progressing   Problem: Activity: Goal: Risk for activity intolerance will decrease Outcome: Progressing   Problem: Nutrition: Goal: Adequate nutrition will be maintained Outcome: Progressing   Problem: Safety: Goal: Ability to remain free from injury will improve Outcome: Progressing   Problem: Skin Integrity: Goal: Risk for impaired skin integrity will decrease Outcome: Progressing   Problem: Fluid Volume: Goal: Compliance with measures to maintain balanced fluid volume will improve Outcome: Progressing

## 2018-02-05 NOTE — Clinical Social Work Note (Signed)
Patient's daughter Chip BoerVicki Minor called CSW to discuss discharge plan. CSW explained to daughter that patient is again medically ready for discharge and has a discharge order. Daughter states that she is unable to care for patient any longer and he can not return to her home. CSW explained that unfortunately patient has no other offers at this time. CSW explained that [facility] but currently there are no beds available and patient can not stay in the hospital to wait for an opening. Daughter states that she refuses to pick up patient. CSW explained that if she refuses to pick up patient that an APS report would be required. Daughter became increasingly angry at CSW and states that she can not care for patient and does not understand why we cant keep him. CSW explained again that patient does not have a medical necessity to be in the hospital at this time and can be cared for at home. Patient also does not need SNF at this time. CSW asked [facility] and daughter states "I guess he will stay on my couch". Daughter again very angry and yelling at CSW and states that "you are no help to me and I wish I had never spoke to you". Daughter then proceeded to hang up the phone on CSW. CSW attempted to call her back but no answer. RN will attempt to call daughter to arrange discharge. CSW will continue to assist as needed.   Ruthe Mannanandace Auguste Tebbetts MSW, [address]CSWA [phone]

## 2018-02-05 NOTE — Discharge Instructions (Signed)
Acute Kidney Injury, Adult Acute kidney injury is a sudden worsening of kidney function. The kidneys are organs that have several jobs. They filter the blood to remove waste products and extra fluid. They also maintain a healthy balance of minerals and hormones in the body, which helps control blood pressure and keep bones strong. With this condition, your kidneys do not do their jobs as well as they should. This condition ranges from mild to severe. Over time it may develop into long-lasting (chronic) kidney disease. Early detection and treatment may prevent acute kidney injury from developing into a chronic condition. What are the causes? Common causes of this condition include:  A problem with blood flow to the kidneys. This may be caused by: ? Low blood pressure (hypotension) or shock. ? Blood loss. ? Heart and blood vessel (cardiovascular) disease. ? Severe burns. ? Liver disease.  Direct damage to the kidneys. This may be caused by: ? Certain medicines. ? A kidney infection. ? Poisoning. ? Being around or in contact with toxic substances. ? A surgical wound. ? A hard, direct hit to the kidney area.  A sudden blockage of urine flow. This may be caused by: ? Cancer. ? Kidney stones. ? An enlarged prostate in males.  What are the signs or symptoms? Symptoms of this condition may not be obvious until the condition becomes severe. Symptoms of this condition can include:  Tiredness (lethargy), or difficulty staying awake.  Nausea or vomiting.  Swelling (edema) of the face, legs, ankles, or feet.  Problems with urination, such as: ? Abdominal pain, or pain along the side of your stomach (flank). ? Decreased urine production. ? Decrease in the force of urine flow.  Muscle twitches and cramps, especially in the legs.  Confusion or trouble concentrating.  Loss of appetite.  Fever.  How is this diagnosed? This condition may be diagnosed with tests, including:  Blood  tests.  Urine tests.  Imaging tests.  A test in which a sample of tissue is removed from the kidneys to be examined under a microscope (kidney biopsy).  How is this treated? Treatment for this condition depends on the cause and how severe the condition is. In mild cases, treatment may not be needed. The kidneys may heal on their own. In more severe cases, treatment will involve:  Treating the cause of the kidney injury. This may involve changing any medicines you are taking or adjusting your dosage.  Fluids. You may need specialized IV fluids to balance your body's needs.  Having a catheter placed to drain urine and prevent blockages.  Preventing problems from occurring. This may mean avoiding certain medicines or procedures that can cause further injury to the kidneys.  In some cases treatment may also require:  A procedure to remove toxic wastes from the body (dialysis or continuous renal replacement therapy - CRRT).  Surgery. This may be done to repair a torn kidney, or to remove the blockage from the urinary system.  Follow these instructions at home: Medicines  Take over-the-counter and prescription medicines only as told by your health care provider.  Do not take any new medicines without your health care provider's approval. Many medicines can worsen your kidney damage.  Do not take any vitamin and mineral supplements without your health care provider's approval. Many nutritional supplements can worsen your kidney damage. Lifestyle  If your health care provider prescribed changes to your diet, follow them. You may need to decrease the amount of protein you eat.  Achieve and maintain a healthy weight. If you need help with this, ask your health care provider.  Start or continue an exercise plan. Try to exercise at least 30 minutes a day, 5 days a week.  Do not use any tobacco products, such as cigarettes, chewing tobacco, and e-cigarettes. If you need help quitting, ask  your health care provider. General instructions  Keep track of your blood pressure. Report changes in your blood pressure as told by your health care provider.  Stay up to date with immunizations. Ask your health care provider which immunizations you need.  Keep all follow-up visits as told by your health care provider. This is important. Where to find more information:  American Association of Kidney Patients: ResidentialShow.iswww.aakp.org  [employer] Kidney Foundation: www.kidney.org  American Kidney Fund: FightingMatch.com.eewww.akfinc.org  Life Options Rehabilitation Program: ? www.lifeoptions.org ? www.kidneyschool.org Contact a health care provider if:  Your symptoms get worse.  You develop new symptoms. Get help right away if:  You develop symptoms of worsening kidney disease, which include: ? Headaches. ? Abnormally dark or light skin. ? Easy bruising. ? Frequent hiccups. ? Chest pain. ? Shortness of breath. ? End of menstruation in women. ? Seizures. ? Confusion or altered mental status. ? Abdominal or back pain. ? Itchiness.  You have a fever.  Your body is producing less urine.  You have pain or bleeding when you urinate. Summary  Acute kidney injury is a sudden worsening of kidney function.  Acute kidney injury can be caused by problems with blood flow to the kidneys, direct damage to the kidneys, and sudden blockage of urine flow.  Symptoms of this condition may not be obvious until it becomes severe. Symptoms may include edema, lethargy, confusion, nausea or vomiting, and problems passing urine.  This condition can usually be diagnosed with blood tests, urine tests, and imaging tests. Sometimes a kidney biopsy is done to diagnose this condition.  Treatment for this condition often involves treating the underlying cause. It is treated with fluids, medicines, dialysis, diet changes, or surgery. This information is not intended to replace advice given to you by your health care provider.  Make sure you discuss any questions you have with your health care provider. Document Released: 12/13/2010 Document Revised: 09/29/2016 Document Reviewed: 05/20/2016 Elsevier Interactive Patient Education  2018 [employer] Day, you were dehydrated when you arrived in the emergency department and your kidney function was abnormal.  You did receive fluids here, and your kidney function improved.  Please continue to drink plenty of clear liquids at home this weekend, and have your primary care doctor recheck your kidney function on Monday.  For your rash, you may apply nystatin powder until it has completely resolved.  Return to the emergency department for severe pain, bleeding, fever, or any other symptoms concerning to you.

## 2018-02-05 NOTE — Progress Notes (Signed)
[facility] - [place] [facility]   PATIENT NAME: [name] HalstedBobby Iversen    MR#:  [id number]  DATE OF BIRTH:  1932/11/30  SUBJECTIVE:  CHIEF COMPLAINT:   Chief Complaint  Patient presents with  . Blood In Stools   Presented to the emergency room for bright red blood per rectum which is thought to be hemorrhoidal.  No further bleeding in the hospital.  Has dementia.    Admitted as family unable to take care of patient  REVIEW OF SYSTEMS:    Review of Systems  Unable to perform ROS: Dementia    DRUG ALLERGIES:  No Known Allergies  VITALS:  Blood pressure 103/81, pulse 67, temperature 97.7 F (36.5 C), temperature source Oral, resp. rate 18, height 5\' 3"  (1.6 m), weight 65.6 kg, SpO2 98 %.  PHYSICAL EXAMINATION:   Physical Exam  GENERAL:  84 y.o.-year-old patient lying in the bed with no acute distress.  EYES: Pupils equal, round, reactive to light and accommodation. No scleral icterus. Extraocular muscles intact.  HEENT: Head atraumatic, normocephalic. Oropharynx and nasopharynx clear.  NECK:  Supple, no jugular venous distention. No thyroid enlargement, no tenderness.  LUNGS: Normal breath sounds bilaterally, no wheezing, rales, rhonchi. No use of accessory muscles of respiration.  CARDIOVASCULAR: S1, S2 normal. No murmurs, rubs, or gallops.  ABDOMEN: Soft, nontender, nondistended. Bowel sounds present. No organomegaly or mass.  EXTREMITIES: No cyanosis, clubbing or edema b/l.    NEUROLOGIC: Cranial nerves II through XII are intact. No focal Motor or sensory deficits b/l.   PSYCHIATRIC: The patient is alert and awake SKIN: No obvious rash, lesion, or ulcer.   LABORATORY PANEL:   CBC Recent Labs  Lab 02/04/18 0506  WBC 7.6  HGB 10.5*  HCT 31.3*  PLT 299   ------------------------------------------------------------------------------------------------------------------ Chemistries  Recent Labs  Lab 02/03/18 1942 02/04/18 0506  NA 134* 135  K 5.1 5.0   CL 108 108  CO2 22 23  GLUCOSE 88 78  BUN 21 19  CREATININE 1.94* 1.84*  CALCIUM 8.7* 8.5*  AST 16  --   ALT 6  --   ALKPHOS 159*  --   BILITOT 0.2*  --    ------------------------------------------------------------------------------------------------------------------  Cardiac Enzymes Recent Labs  Lab 02/03/18 1942  TROPONINI <0.03   ------------------------------------------------------------------------------------------------------------------  RADIOLOGY:  No results found.   ASSESSMENT AND PLAN:   *Bright red blood per rectum.  Hemoglobin stable.  Likely hemorrhoidal.  No further work-up at this time. No further bleeding in the hospital.  *Dementia.  Monitor for inpatient delirium.  *Diabetes mellitus.  Check hemoglobin A1c.  Continue sliding scale insulin.  Blood sugars in the normal range at this time.  *CKD stage III is stable.  No acute kidney injury.  *DVT prophylaxis.  SCDs.  Hold heparin due to concern for blood in stool.  Patient can be discharged when able to pick him up  All the records are reviewed and case discussed with Care Management/Social Worker Management plans discussed with the patient, family and they are in agreement.  CODE STATUS: FULL CODE  DVT Prophylaxis: SCDs  TOTAL TIME TAKING CARE OF THIS PATIENT: 35 minutes.   POSSIBLE D/C IN 1-2 DAYS, DEPENDING ON CLINICAL CONDITION.  Molinda BailiffSrikar R Koen Antilla M.D on 02/05/2018 at 11:48 PM  Between 7am to 6pm - Pager - [phone]  After 6pm go to [email or web address] - password [email or web address] [facility]  [facility] [place] [facility]  Office  [phone]  CC: Primary care physician; [place], [facility]  Note: This  dictation was prepared with Dragon dictation along with smaller phrase technology. Any transcriptional errors that result from this process are unintentional.

## 2018-02-06 LAB — GLUCOSE, CAPILLARY: Glucose-Capillary: 78 mg/dL (ref 70–99)

## 2018-02-06 NOTE — Progress Notes (Signed)
Pt D/C to home with daughter Chip BoerVicki. IV removed on previous shift. All education completed. All questions answered. Paperwork given to daughter with prescription.

## 2018-02-20 NOTE — Discharge Summary (Signed)
[facility] [place] at [facility]   PATIENT NAME: [name] [name]    MR#:  [id number]  DATE OF BIRTH:  09-24-1932  DATE OF ADMISSION:  02/02/2018 ADMITTING PHYSICIAN: Oralia Manis, MD  DATE OF DISCHARGE: 02/06/2018 10:16 AM  PRIMARY CARE PHYSICIAN: [place], [facility]   ADMISSION DIAGNOSIS:  Dehydration [E86.0] Renal insufficiency [N28.9] Acute kidney injury (HCC) [N17.9] Groin rash [R21] Hemorrhoids, unspecified hemorrhoid type [K64.9]  DISCHARGE DIAGNOSIS:  Principal Problem:   Acute kidney injury superimposed on CKD (HCC) Active Problems:   Dehydration   Diabetes (HCC)   HTN (hypertension)   HLD (hyperlipidemia)   COPD (chronic obstructive pulmonary disease) (HCC)   Weakness   SECONDARY DIAGNOSIS:   Past Medical History:  Diagnosis Date  . COPD (chronic obstructive pulmonary disease) (HCC)   . Diabetes (HCC)   . HLD (hyperlipidemia)   . HTN (hypertension)      ADMITTING HISTORY  HISTORY OF PRESENT ILLNESS:  [name] [name]  is a 84 y.o. male who presents with chief complaint as above.  Patient initially presented to the ED yesterday from home with family member with a report of blood in stool.  Work-up here did not show any significant active GI bleed, likely the blood was from hemorrhoids.  However, the patient did present somewhat covered in fecal matter.  The report is that he had bottles of urine around the room in his home.  Family member state that due to his dementia he has become very difficult to care for.  On work-up in the ED he does have acute kidney injury.  Hospitalist were called for admission  HOSPITAL COURSE:   *Bright red blood per rectum. Hemoglobin stable. Likely hemorrhoidal.   No further work-up at this time. No further bleeding in the hospital.  *Dementia.  Monitor for inpatient delirium.  *Diabetes mellitus.  Checked hemoglobin A1c.  Continue sliding scale insulin.  Blood sugars in the normal range at this  time.  *CKD stage III is stable.  No acute kidney injury.  *DVT prophylaxis.  SCDs.   Patient discharged back home with home health services  CONSULTS OBTAINED:    DRUG ALLERGIES:  No Known Allergies  DISCHARGE MEDICATIONS:   Allergies as of 02/06/2018   No Known Allergies     Medication List    TAKE these medications   LORazepam 0.5 MG tablet Commonly known as:  ATIVAN Take 1 tablet (0.5 mg total) by mouth 2 (two) times daily as needed for anxiety.   nystatin powder Commonly known as:  MYCOSTATIN/NYSTOP Apply topically 4 (four) times daily.       Today   VITAL SIGNS:  Blood pressure 106/62, pulse 74, temperature 98.2 F (36.8 C), temperature source Oral, resp. rate 18, height 5\' 3"  (1.6 m), weight 65.6 kg, SpO2 100 %.  I/O:  No intake or output data in the 24 hours ending 02/20/18 1528  PHYSICAL EXAMINATION:  Physical Exam  GENERAL:  84 y.o.-year-old patient lying in the bed with no acute distress.  LUNGS: Normal breath sounds bilaterally, no wheezing, rales,rhonchi or crepitation. No use of accessory muscles of respiration.  CARDIOVASCULAR: S1, S2 normal. No murmurs, rubs, or gallops.  ABDOMEN: Soft, non-tender, non-distended. Bowel sounds present. No organomegaly or mass.  NEUROLOGIC: Moves all 4 extremities. PSYCHIATRIC: The patient is alert and oriented x 3.  SKIN: No obvious rash, lesion, or ulcer.   DATA REVIEW:   CBC No results for input(s): WBC, HGB, HCT, PLT in the last 168  hours.  Chemistries  No results for input(s): NA, K, CL, CO2, GLUCOSE, BUN, CREATININE, CALCIUM, MG, AST, ALT, ALKPHOS, BILITOT in the last 168 hours.  Invalid input(s): GFRCGP  Cardiac Enzymes No results for input(s): TROPONINI in the last 168 hours.  Microbiology Results  Results for orders placed or performed during the hospital encounter of 03/12/07  Urine culture     Status: None   Collection Time: 03/11/07  7:20 PM  Result Value Ref Range Status   Specimen  Description URINE, RANDOM  Final   Special Requests NONE  Final   Colony Count NO GROWTH  Final   Culture NO GROWTH  Final   Report Status 03/13/2007 FINAL  Final    RADIOLOGY:  No results found.  Follow up with PCP in 1 week.  Management plans discussed with the patient, family and they are in agreement.  CODE STATUS:  Code Status History    Date Active Date Inactive Code Status Order ID Comments User Context   02/03/2018 2152 02/06/2018 1355 Full Code [id number]  Oralia Manis, MD Inpatient      TOTAL TIME TAKING CARE OF THIS PATIENT ON DAY OF DISCHARGE: more than 30 minutes.   Molinda Bailiff Gayleen Sholtz M.D on 02/20/2018 at 3:28 PM  Between 7am to 6pm - Pager - [phone]  After 6pm go to [email or web address] - password [email or web address]  [facility] [place] [facility]  Office  [phone]  CC: Primary care physician; [facility]  Note: This dictation was prepared with Dragon dictation along with smaller phrase technology. Any transcriptional errors that result from this process are unintentional.

## 2018-06-13 DEATH — deceased
